# Patient Record
Sex: Female | Born: 1985 | Race: Black or African American | Hispanic: No | Smoking: Never smoker
Health system: Southern US, Community
[De-identification: ages and names within clinical notes are randomized; demographics above are authoritative.]

## PROBLEM LIST (undated history)

## (undated) ENCOUNTER — Inpatient Hospital Stay (HOSPITAL_COMMUNITY): Payer: Self-pay

## (undated) DIAGNOSIS — Z789 Other specified health status: Secondary | ICD-10-CM

## (undated) DIAGNOSIS — Z9889 Other specified postprocedural states: Secondary | ICD-10-CM

## (undated) DIAGNOSIS — R112 Nausea with vomiting, unspecified: Secondary | ICD-10-CM

## (undated) DIAGNOSIS — R011 Cardiac murmur, unspecified: Secondary | ICD-10-CM

## (undated) DIAGNOSIS — R2243 Localized swelling, mass and lump, lower limb, bilateral: Secondary | ICD-10-CM

## (undated) HISTORY — PX: BREAST SURGERY: SHX581

## (undated) HISTORY — PX: INCISIONAL BREAST BIOPSY: SHX1812

## (undated) HISTORY — DX: Cardiac murmur, unspecified: R01.1

---

## 2009-06-29 ENCOUNTER — Emergency Department (HOSPITAL_COMMUNITY): Admission: EM | Admit: 2009-06-29 | Discharge: 2009-06-29 | Payer: Self-pay | Admitting: Family Medicine

## 2010-04-26 LAB — POCT URINALYSIS DIP (DEVICE)
Bilirubin Urine: NEGATIVE
Glucose, UA: NEGATIVE mg/dL
Hgb urine dipstick: NEGATIVE
Specific Gravity, Urine: 1.025 (ref 1.005–1.030)
pH: 7 (ref 5.0–8.0)

## 2010-04-26 LAB — POCT PREGNANCY, URINE: Preg Test, Ur: NEGATIVE

## 2012-11-05 ENCOUNTER — Other Ambulatory Visit: Payer: Self-pay

## 2013-07-03 ENCOUNTER — Other Ambulatory Visit (INDEPENDENT_AMBULATORY_CARE_PROVIDER_SITE_OTHER): Payer: 59

## 2013-07-03 VITALS — BP 118/80 | HR 91 | Temp 99.2°F | Ht 66.0 in | Wt 202.0 lb

## 2013-07-03 DIAGNOSIS — N912 Amenorrhea, unspecified: Secondary | ICD-10-CM

## 2013-07-03 LAB — POCT URINE PREGNANCY: PREG TEST UR: POSITIVE

## 2013-07-03 NOTE — Patient Instructions (Signed)
Patient informed that her test is positive. Patient given prenatal vitamin samples. Patient scheduled NOB appointment with Dr Delsa Sale and reviewed symptoms of pregnancy problems.

## 2013-07-03 NOTE — Progress Notes (Signed)
Patient had positive home test.

## 2013-07-23 ENCOUNTER — Ambulatory Visit (HOSPITAL_COMMUNITY): Payer: 59 | Attending: Obstetrics & Gynecology

## 2013-07-30 LAB — OB RESULTS CONSOLE ABO/RH: "RH Type ": POSITIVE

## 2013-07-30 LAB — OB RESULTS CONSOLE GC/CHLAMYDIA
Chlamydia: NEGATIVE
Gonorrhea: NEGATIVE

## 2013-07-30 LAB — OB RESULTS CONSOLE ANTIBODY SCREEN: Antibody Screen: NEGATIVE

## 2013-07-30 LAB — OB RESULTS CONSOLE RPR: RPR: NONREACTIVE

## 2013-07-30 LAB — OB RESULTS CONSOLE HEPATITIS B SURFACE ANTIGEN: Hepatitis B Surface Ag: NEGATIVE

## 2013-07-30 LAB — OB RESULTS CONSOLE HIV ANTIBODY (ROUTINE TESTING): HIV: NONREACTIVE

## 2013-07-30 LAB — OB RESULTS CONSOLE RUBELLA ANTIBODY, IGM: RUBELLA: IMMUNE

## 2013-08-05 ENCOUNTER — Encounter: Payer: 59 | Admitting: Obstetrics & Gynecology

## 2013-08-16 ENCOUNTER — Encounter (HOSPITAL_COMMUNITY): Payer: Self-pay

## 2013-08-16 ENCOUNTER — Ambulatory Visit (HOSPITAL_COMMUNITY): Admission: RE | Admit: 2013-08-16 | Payer: 59 | Source: Ambulatory Visit

## 2013-08-16 ENCOUNTER — Ambulatory Visit (HOSPITAL_COMMUNITY)
Admission: RE | Admit: 2013-08-16 | Discharge: 2013-08-16 | Disposition: A | Discharge status: Home / Self Care | Payer: 59 | Source: Ambulatory Visit | Attending: Obstetrics & Gynecology | Admitting: Obstetrics & Gynecology

## 2013-08-16 ENCOUNTER — Other Ambulatory Visit (HOSPITAL_COMMUNITY): Payer: Self-pay | Admitting: Obstetrics & Gynecology

## 2013-08-16 ENCOUNTER — Encounter (HOSPITAL_COMMUNITY): Payer: 59

## 2013-08-16 DIAGNOSIS — Z3689 Encounter for other specified antenatal screening: Secondary | ICD-10-CM | POA: Insufficient documentation

## 2013-08-16 DIAGNOSIS — O289 Unspecified abnormal findings on antenatal screening of mother: Secondary | ICD-10-CM | POA: Insufficient documentation

## 2013-08-16 DIAGNOSIS — O283 Abnormal ultrasonic finding on antenatal screening of mother: Secondary | ICD-10-CM

## 2013-12-09 ENCOUNTER — Encounter (HOSPITAL_COMMUNITY): Payer: Self-pay

## 2014-01-22 LAB — OB RESULTS CONSOLE GBS: GBS: NEGATIVE

## 2014-02-03 ENCOUNTER — Encounter: Payer: Self-pay | Admitting: *Deleted

## 2014-02-04 ENCOUNTER — Encounter: Payer: Self-pay | Admitting: Obstetrics & Gynecology

## 2014-02-07 NOTE — L&D Delivery Note (Signed)
Delivery Note At 10:49 PM a viable female was delivered via Vaginal, Spontaneous Delivery (Presentation: ; Occiput Posterior).  APGAR: 8, 9; weight  .   Placenta status: Intact, Spontaneous.  Cord: 3 vessels with the following complications: None.  Cord pH: na  Anesthesia: Epidural  Episiotomy: None Lacerations: 2nd degree Suture Repair: 2.0 chromic Est. Blood Loss (mL): 300  Mom to postpartum.  Baby to Couplet care / Skin to Skin.  Liset Mcmonigle S 02/16/2014, 11:02 PM

## 2014-02-15 ENCOUNTER — Encounter (HOSPITAL_COMMUNITY): Payer: Self-pay | Admitting: *Deleted

## 2014-02-15 ENCOUNTER — Inpatient Hospital Stay (HOSPITAL_COMMUNITY)
Admission: AD | Admit: 2014-02-15 | Discharge: 2014-02-15 | Disposition: A | Discharge status: Home / Self Care | Payer: 59 | Source: Ambulatory Visit | Attending: Obstetrics and Gynecology | Admitting: Obstetrics and Gynecology

## 2014-02-15 DIAGNOSIS — O9989 Other specified diseases and conditions complicating pregnancy, childbirth and the puerperium: Secondary | ICD-10-CM | POA: Diagnosis present

## 2014-02-15 DIAGNOSIS — Z3A39 39 weeks gestation of pregnancy: Secondary | ICD-10-CM | POA: Diagnosis not present

## 2014-02-15 LAB — POCT FERN TEST: POCT Fern Test: NEGATIVE

## 2014-02-15 NOTE — Discharge Instructions (Signed)
Third Trimester of Pregnancy The third trimester is from week 29 through week 42, months 7 through 9. The third trimester is a time when the fetus is growing rapidly. At the end of the ninth month, the fetus is about 20 inches in length and weighs 6-10 pounds.  BODY CHANGES Your body goes through many changes during pregnancy. The changes vary from woman to woman.   Your weight will continue to increase. You can expect to gain 25-35 pounds (11-16 kg) by the end of the pregnancy.  You may begin to get stretch marks on your hips, abdomen, and breasts.  You may urinate more often because the fetus is moving lower into your pelvis and pressing on your bladder.  You may develop or continue to have heartburn as a result of your pregnancy.  You may develop constipation because certain hormones are causing the muscles that push waste through your intestines to slow down.  You may develop hemorrhoids or swollen, bulging veins (varicose veins).  You may have pelvic pain because of the weight gain and pregnancy hormones relaxing your joints between the bones in your pelvis. Backaches may result from overexertion of the muscles supporting your posture.  You may have changes in your hair. These can include thickening of your hair, rapid growth, and changes in texture. Some women also have hair loss during or after pregnancy, or hair that feels dry or thin. Your hair will most likely return to normal after your baby is born.  Your breasts will continue to grow and be tender. A yellow discharge may leak from your breasts called colostrum.  Your belly button may stick out.  You may feel short of breath because of your expanding uterus.  You may notice the fetus "dropping," or moving lower in your abdomen.  You may have a bloody mucus discharge. This usually occurs a few days to a week before labor begins.  Your cervix becomes thin and soft (effaced) near your due date. WHAT TO EXPECT AT YOUR PRENATAL  EXAMS  You will have prenatal exams every 2 weeks until week 36. Then, you will have weekly prenatal exams. During a routine prenatal visit:  You will be weighed to make sure you and the fetus are growing normally.  Your blood pressure is taken.  Your abdomen will be measured to track your baby's growth.  The fetal heartbeat will be listened to.  Any test results from the previous visit will be discussed.  You may have a cervical check near your due date to see if you have effaced. At around 36 weeks, your caregiver will check your cervix. At the same time, your caregiver will also perform a test on the secretions of the vaginal tissue. This test is to determine if a type of bacteria, Group B streptococcus, is present. Your caregiver will explain this further. Your caregiver may ask you:  What your birth plan is.  How you are feeling.  If you are feeling the baby move.  If you have had any abnormal symptoms, such as leaking fluid, bleeding, severe headaches, or abdominal cramping.  If you have any questions. Other tests or screenings that may be performed during your third trimester include:  Blood tests that check for low iron levels (anemia).  Fetal testing to check the health, activity level, and growth of the fetus. Testing is done if you have certain medical conditions or if there are problems during the pregnancy. FALSE LABOR You may feel small, irregular contractions that  eventually go away. These are called Braxton Hicks contractions, or false labor. Contractions may last for hours, days, or even weeks before true labor sets in. If contractions come at regular intervals, intensify, or become painful, it is best to be seen by your caregiver.  SIGNS OF LABOR   Menstrual-like cramps.  Contractions that are 5 minutes apart or less.  Contractions that start on the top of the uterus and spread down to the lower abdomen and back.  A sense of increased pelvic pressure or back  pain.  A watery or bloody mucus discharge that comes from the vagina. If you have any of these signs before the 37th week of pregnancy, call your caregiver right away. You need to go to the hospital to get checked immediately. HOME CARE INSTRUCTIONS   Avoid all smoking, herbs, alcohol, and unprescribed drugs. These chemicals affect the formation and growth of the baby.  Follow your caregiver's instructions regarding medicine use. There are medicines that are either safe or unsafe to take during pregnancy.  Exercise only as directed by your caregiver. Experiencing uterine cramps is a good sign to stop exercising.  Continue to eat regular, healthy meals.  Wear a good support bra for breast tenderness.  Do not use hot tubs, steam rooms, or saunas.  Wear your seat belt at all times when driving.  Avoid raw meat, uncooked cheese, cat litter boxes, and soil used by cats. These carry germs that can cause birth defects in the baby.  Take your prenatal vitamins.  Try taking a stool softener (if your caregiver approves) if you develop constipation. Eat more high-fiber foods, such as fresh vegetables or fruit and whole grains. Drink plenty of fluids to keep your urine clear or pale yellow.  Take warm sitz baths to soothe any pain or discomfort caused by hemorrhoids. Use hemorrhoid cream if your caregiver approves.  If you develop varicose veins, wear support hose. Elevate your feet for 15 minutes, 3-4 times a day. Limit salt in your diet.  Avoid heavy lifting, wear low heal shoes, and practice good posture.  Rest a lot with your legs elevated if you have leg cramps or low back pain.  Visit your dentist if you have not gone during your pregnancy. Use a soft toothbrush to brush your teeth and be gentle when you floss.  A sexual relationship may be continued unless your caregiver directs you otherwise.  Do not travel far distances unless it is absolutely necessary and only with the approval  of your caregiver.  Take prenatal classes to understand, practice, and ask questions about the labor and delivery.  Make a trial run to the hospital.  Pack your hospital bag.  Prepare the baby's nursery.  Continue to go to all your prenatal visits as directed by your caregiver. SEEK MEDICAL CARE IF:  You are unsure if you are in labor or if your water has broken.  You have dizziness.  You have mild pelvic cramps, pelvic pressure, or nagging pain in your abdominal area.  You have persistent nausea, vomiting, or diarrhea.  You have a bad smelling vaginal discharge.  You have pain with urination. SEEK IMMEDIATE MEDICAL CARE IF:   You have a fever.  You are leaking fluid from your vagina.  You have spotting or bleeding from your vagina.  You have severe abdominal cramping or pain.  You have rapid weight loss or gain.  You have shortness of breath with chest pain.  You notice sudden or extreme swelling  of your face, hands, ankles, feet, or legs.  You have not felt your baby move in over an hour.  You have severe headaches that do not go away with medicine.  You have vision changes. Document Released: 01/18/2001 Document Revised: 01/29/2013 Document Reviewed: 03/27/2012 [facility] Patient Information 2015 [place], [place]. This information is not intended to replace advice given to you by your health care provider. Make sure you discuss any questions you have with your health care provider. Fetal Movement Counts Patient Name: __________________________________________________ Patient Due Date: ____________________ Performing a fetal movement count is highly recommended in high-risk pregnancies, but it is good for every pregnant woman to do. Your health care provider may ask you to start counting fetal movements at 28 weeks of the pregnancy. Fetal movements often increase:  After eating a full meal.  After physical activity.  After eating or drinking something sweet or  cold.  At rest. Pay attention to when you feel the baby is most active. This will help you notice a pattern of your baby's sleep and wake cycles and what factors contribute to an increase in fetal movement. It is important to perform a fetal movement count at the same time each day when your baby is normally most active.  HOW TO COUNT FETAL MOVEMENTS  Find a quiet and comfortable area to sit or lie down on your left side. Lying on your left side provides the best blood and oxygen circulation to your baby.  Write down the day and time on a sheet of paper or in a journal.  Start counting kicks, flutters, swishes, rolls, or jabs in a 2-hour period. You should feel at least 10 movements within 2 hours.  If you do not feel 10 movements in 2 hours, wait 2-3 hours and count again. Look for a change in the pattern or not enough counts in 2 hours. SEEK MEDICAL CARE IF:  You feel less than 10 counts in 2 hours, tried twice.  There is no movement in over an hour.  The pattern is changing or taking longer each day to reach 10 counts in 2 hours.  You feel the baby is not moving as he or she usually does. Date: ____________ Movements: ____________ Start time: ____________ Elizebeth Koller time: ____________  Date: ____________ Movements: ____________ Start time: ____________ Elizebeth Koller time: ____________ Date: ____________ Movements: ____________ Start time: ____________ Elizebeth Koller time: ____________ Date: ____________ Movements: ____________ Start time: ____________ Elizebeth Koller time: ____________ Date: ____________ Movements: ____________ Start time: ____________ Elizebeth Koller time: ____________ Date: ____________ Movements: ____________ Start time: ____________ Elizebeth Koller time: ____________ Date: ____________ Movements: ____________ Start time: ____________ Elizebeth Koller time: ____________ Date: ____________ Movements: ____________ Start time: ____________ Elizebeth Koller time: ____________  Date: ____________ Movements: ____________ Start time:  ____________ Elizebeth Koller time: ____________ Date: ____________ Movements: ____________ Start time: ____________ Elizebeth Koller time: ____________ Date: ____________ Movements: ____________ Start time: ____________ Elizebeth Koller time: ____________ Date: ____________ Movements: ____________ Start time: ____________ Elizebeth Koller time: ____________ Date: ____________ Movements: ____________ Start time: ____________ Elizebeth Koller time: ____________ Date: ____________ Movements: ____________ Start time: ____________ Elizebeth Koller time: ____________ Date: ____________ Movements: ____________ Start time: ____________ Elizebeth Koller time: ____________  Date: ____________ Movements: ____________ Start time: ____________ Elizebeth Koller time: ____________ Date: ____________ Movements: ____________ Start time: ____________ Elizebeth Koller time: ____________ Date: ____________ Movements: ____________ Start time: ____________ Elizebeth Koller time: ____________ Date: ____________ Movements: ____________ Start time: ____________ Elizebeth Koller time: ____________ Date: ____________ Movements: ____________ Start time: ____________ Elizebeth Koller time: ____________ Date: ____________ Movements: ____________ Start time: ____________ Elizebeth Koller time: ____________ Date: ____________ Movements: ____________ Start time: ____________ Elizebeth Koller time:  ____________  Date: ____________ Movements: ____________ Start time: ____________ Elizebeth Koller time: ____________ Date: ____________ Movements: ____________ Start time: ____________ Elizebeth Koller time: ____________ Date: ____________ Movements: ____________ Start time: ____________ Elizebeth Koller time: ____________ Date: ____________ Movements: ____________ Start time: ____________ Elizebeth Koller time: ____________ Date: ____________ Movements: ____________ Start time: ____________ Elizebeth Koller time: ____________ Date: ____________ Movements: ____________ Start time: ____________ Elizebeth Koller time: ____________ Date: ____________ Movements: ____________ Start time: ____________ Elizebeth Koller time: ____________  Date:  ____________ Movements: ____________ Start time: ____________ Elizebeth Koller time: ____________ Date: ____________ Movements: ____________ Start time: ____________ Elizebeth Koller time: ____________ Date: ____________ Movements: ____________ Start time: ____________ Elizebeth Koller time: ____________ Date: ____________ Movements: ____________ Start time: ____________ Elizebeth Koller time: ____________ Date: ____________ Movements: ____________ Start time: ____________ Elizebeth Koller time: ____________ Date: ____________ Movements: ____________ Start time: ____________ Elizebeth Koller time: ____________ Date: ____________ Movements: ____________ Start time: ____________ Elizebeth Koller time: ____________  Date: ____________ Movements: ____________ Start time: ____________ Elizebeth Koller time: ____________ Date: ____________ Movements: ____________ Start time: ____________ Elizebeth Koller time: ____________ Date: ____________ Movements: ____________ Start time: ____________ Elizebeth Koller time: ____________ Date: ____________ Movements: ____________ Start time: ____________ Elizebeth Koller time: ____________ Date: ____________ Movements: ____________ Start time: ____________ Elizebeth Koller time: ____________ Date: ____________ Movements: ____________ Start time: ____________ Elizebeth Koller time: ____________ Date: ____________ Movements: ____________ Start time: ____________ Elizebeth Koller time: ____________  Date: ____________ Movements: ____________ Start time: ____________ Elizebeth Koller time: ____________ Date: ____________ Movements: ____________ Start time: ____________ Elizebeth Koller time: ____________ Date: ____________ Movements: ____________ Start time: ____________ Elizebeth Koller time: ____________ Date: ____________ Movements: ____________ Start time: ____________ Elizebeth Koller time: ____________ Date: ____________ Movements: ____________ Start time: ____________ Elizebeth Koller time: ____________ Date: ____________ Movements: ____________ Start time: ____________ Elizebeth Koller time: ____________ Date: ____________ Movements: ____________ Start  time: ____________ Elizebeth Koller time: ____________  Date: ____________ Movements: ____________ Start time: ____________ Elizebeth Koller time: ____________ Date: ____________ Movements: ____________ Start time: ____________ Elizebeth Koller time: ____________ Date: ____________ Movements: ____________ Start time: ____________ Elizebeth Koller time: ____________ Date: ____________ Movements: ____________ Start time: ____________ Elizebeth Koller time: ____________ Date: ____________ Movements: ____________ Start time: ____________ Elizebeth Koller time: ____________ Date: ____________ Movements: ____________ Start time: ____________ Elizebeth Koller time: ____________ Document Released: 02/23/2006 Document Revised: 06/10/2013 Document Reviewed: 11/21/2011 [employer] Patient Information 2015 [place]. This information is not intended to replace advice given to you by your health care provider. Make sure you discuss any questions you have with your health care provider.

## 2014-02-15 NOTE — MAU Note (Signed)
Pt presents to MAU with complaints of leakage of clear fluid at 730 this morning. Denies any vaginal bleeding

## 2014-02-16 ENCOUNTER — Inpatient Hospital Stay (HOSPITAL_COMMUNITY): Payer: 59 | Admitting: Anesthesiology

## 2014-02-16 ENCOUNTER — Encounter (HOSPITAL_COMMUNITY): Payer: Self-pay | Admitting: *Deleted

## 2014-02-16 ENCOUNTER — Inpatient Hospital Stay (HOSPITAL_COMMUNITY)
Admission: AD | Admit: 2014-02-16 | Discharge: 2014-02-18 | DRG: 775 | Disposition: A | Discharge status: Home / Self Care | Payer: 59 | Source: Ambulatory Visit | Attending: Obstetrics and Gynecology | Admitting: Obstetrics and Gynecology

## 2014-02-16 DIAGNOSIS — D279 Benign neoplasm of unspecified ovary: Secondary | ICD-10-CM | POA: Diagnosis present

## 2014-02-16 DIAGNOSIS — Z3A38 38 weeks gestation of pregnancy: Secondary | ICD-10-CM | POA: Diagnosis present

## 2014-02-16 DIAGNOSIS — O99214 Obesity complicating childbirth: Secondary | ICD-10-CM | POA: Diagnosis present

## 2014-02-16 DIAGNOSIS — Z6841 Body Mass Index (BMI) 40.0 and over, adult: Secondary | ICD-10-CM | POA: Diagnosis not present

## 2014-02-16 HISTORY — DX: Other specified health status: Z78.9

## 2014-02-16 LAB — CBC
HCT: 37.1 % (ref 36.0–46.0)
Hemoglobin: 12.4 g/dL (ref 12.0–15.0)
MCH: 29.5 pg (ref 26.0–34.0)
MCHC: 33.4 g/dL (ref 30.0–36.0)
MCV: 88.1 fL (ref 78.0–100.0)
Platelets: 185 10*3/uL (ref 150–400)
RBC: 4.21 MIL/uL (ref 3.87–5.11)
RDW: 13.9 % (ref 11.5–15.5)
WBC: 11.5 10*3/uL — AB (ref 4.0–10.5)

## 2014-02-16 LAB — TYPE AND SCREEN
ABO/RH(D): B POS
ANTIBODY SCREEN: NEGATIVE

## 2014-02-16 LAB — ABO/RH: ABO/RH(D): B POS

## 2014-02-16 LAB — POCT FERN TEST: POCT Fern Test: POSITIVE

## 2014-02-16 MED ORDER — OXYTOCIN 40 UNITS IN LACTATED RINGERS INFUSION - SIMPLE MED
62.5000 mL/h | INTRAVENOUS | Status: DC
Start: 2014-02-16 — End: 2014-02-17

## 2014-02-16 MED ORDER — FENTANYL 2.5 MCG/ML BUPIVACAINE 1/10 % EPIDURAL INFUSION (WH - ANES)
14.0000 mL/h | INTRAMUSCULAR | Status: DC | PRN
  Administered 2014-02-16 (×2): 14 mL/h via EPIDURAL
  Filled 2014-02-16: qty 125

## 2014-02-16 MED ORDER — FLEET ENEMA 7-19 GM/118ML RE ENEM
1.0000 | ENEMA | RECTAL | Status: DC | PRN
Start: 2014-02-16 — End: 2014-02-17

## 2014-02-16 MED ORDER — OXYCODONE-ACETAMINOPHEN 5-325 MG PO TABS: 2.0000 | ORAL_TABLET | ORAL | Status: DC | PRN

## 2014-02-16 MED ORDER — OXYCODONE-ACETAMINOPHEN 5-325 MG PO TABS: 1.0000 | ORAL_TABLET | ORAL | Status: DC | PRN

## 2014-02-16 MED ORDER — PHENYLEPHRINE 40 MCG/ML (10ML) SYRINGE FOR IV PUSH (FOR BLOOD PRESSURE SUPPORT)
PREFILLED_SYRINGE | INTRAVENOUS | Status: AC
  Filled 2014-02-16: qty 20

## 2014-02-16 MED ORDER — LACTATED RINGERS IV SOLN
INTRAVENOUS | Status: DC
  Administered 2014-02-16 (×3): via INTRAVENOUS

## 2014-02-16 MED ORDER — OXYTOCIN 40 UNITS IN LACTATED RINGERS INFUSION - SIMPLE MED
1.0000 m[IU]/min | INTRAVENOUS | Status: DC
  Administered 2014-02-16: 2 m[IU]/min via INTRAVENOUS
  Filled 2014-02-16: qty 1000

## 2014-02-16 MED ORDER — LIDOCAINE HCL (PF) 1 % IJ SOLN
30.0000 mL | INTRAMUSCULAR | Status: DC | PRN
  Administered 2014-02-16: 30 mL via SUBCUTANEOUS
  Filled 2014-02-16: qty 30

## 2014-02-16 MED ORDER — EPHEDRINE 5 MG/ML INJ
10.0000 mg | INTRAVENOUS | Status: DC | PRN
  Filled 2014-02-16: qty 2

## 2014-02-16 MED ORDER — ACETAMINOPHEN 325 MG PO TABS: 650.0000 mg | ORAL_TABLET | ORAL | Status: DC | PRN

## 2014-02-16 MED ORDER — CITRIC ACID-SODIUM CITRATE 334-500 MG/5ML PO SOLN: 30.0000 mL | ORAL | Status: DC | PRN

## 2014-02-16 MED ORDER — OXYCODONE-ACETAMINOPHEN 5-325 MG PO TABS
1.0000 | ORAL_TABLET | ORAL | Status: DC | PRN
Start: 2014-02-16 — End: 2014-02-17

## 2014-02-16 MED ORDER — FENTANYL 2.5 MCG/ML BUPIVACAINE 1/10 % EPIDURAL INFUSION (WH - ANES)
INTRAMUSCULAR | Status: DC | PRN
  Administered 2014-02-16: 14 mL/h via EPIDURAL

## 2014-02-16 MED ORDER — PHENYLEPHRINE 40 MCG/ML (10ML) SYRINGE FOR IV PUSH (FOR BLOOD PRESSURE SUPPORT)
80.0000 ug | PREFILLED_SYRINGE | INTRAVENOUS | Status: DC | PRN
  Filled 2014-02-16: qty 2

## 2014-02-16 MED ORDER — DIPHENHYDRAMINE HCL 50 MG/ML IJ SOLN: 12.5000 mg | INTRAMUSCULAR | Status: DC | PRN

## 2014-02-16 MED ORDER — FENTANYL 2.5 MCG/ML BUPIVACAINE 1/10 % EPIDURAL INFUSION (WH - ANES)
INTRAMUSCULAR | Status: AC
  Administered 2014-02-16: 14 mL/h via EPIDURAL
  Filled 2014-02-16: qty 125

## 2014-02-16 MED ORDER — ONDANSETRON HCL 4 MG/2ML IJ SOLN: 4.0000 mg | Freq: Four times a day (QID) | INTRAMUSCULAR | Status: DC | PRN

## 2014-02-16 MED ORDER — TERBUTALINE SULFATE 1 MG/ML IJ SOLN: 0.2500 mg | Freq: Once | INTRAMUSCULAR | Status: AC | PRN

## 2014-02-16 MED ORDER — LACTATED RINGERS IV SOLN
500.0000 mL | INTRAVENOUS | Status: DC | PRN
  Administered 2014-02-16: 500 mL via INTRAVENOUS

## 2014-02-16 MED ORDER — LACTATED RINGERS IV SOLN: 500.0000 mL | Freq: Once | INTRAVENOUS | Status: DC

## 2014-02-16 MED ORDER — LIDOCAINE HCL (PF) 1 % IJ SOLN
INTRAMUSCULAR | Status: DC | PRN
  Administered 2014-02-16: 5 mL
  Administered 2014-02-16: 3 mL
  Administered 2014-02-16: 5 mL

## 2014-02-16 MED ORDER — OXYTOCIN BOLUS FROM INFUSION
500.0000 mL | INTRAVENOUS | Status: DC
  Administered 2014-02-16: 500 mL via INTRAVENOUS

## 2014-02-16 NOTE — MAU Note (Signed)
Contractions since 1630 Sat. Was seen MAU sat morning and 1cm. Ctx closer since 2000. Some mucousy d/c but no bleeding

## 2014-02-16 NOTE — Progress Notes (Signed)
Report called to [place] in [place]. Strip reviewed. Pt to 163 via w/c

## 2014-02-16 NOTE — Progress Notes (Signed)
Dr Radene Knee notified of pt's admission and status. Aware of SROM at 0330 with cl fld, sve, ctx pattern. Will admit to [facility]

## 2014-02-16 NOTE — MAU Note (Signed)
Pt went to BR on arrival to Rm #4 and started leaking cl fld. Fern positive.

## 2014-02-16 NOTE — Anesthesia Preprocedure Evaluation (Signed)
Anesthesia Evaluation  Patient identified by MRN, date of birth, ID band Patient awake    Reviewed: Allergy & Precautions, H&P , NPO status , Patient's Chart, lab work & pertinent test results  History of Anesthesia Complications Negative for: history of anesthetic complications  Airway Mallampati: II  TM Distance: >3 FB Neck ROM: full    Dental no notable dental hx. (+) Teeth Intact   Pulmonary neg pulmonary ROS,  breath sounds clear to auscultation  Pulmonary exam normal       Cardiovascular negative cardio ROS  Rhythm:regular Rate:Normal     Neuro/Psych negative neurological ROS  negative psych ROS   GI/Hepatic negative GI ROS, Neg liver ROS,   Endo/Other  Morbid obesity  Renal/GU negative Renal ROS  negative genitourinary   Musculoskeletal   Abdominal Normal abdominal exam  (+)   Peds  Hematology negative hematology ROS (+)   Anesthesia Other Findings   Reproductive/Obstetrics (+) Pregnancy                             Anesthesia Physical Anesthesia Plan  ASA: II  Anesthesia Plan: Epidural   Post-op Pain Management:    Induction:   Airway Management Planned:   Additional Equipment:   Intra-op Plan:   Post-operative Plan:   Informed Consent: I have reviewed the patients History and Physical, chart, labs and discussed the procedure including the risks, benefits and alternatives for the proposed anesthesia with the patient or authorized representative who has indicated his/her understanding and acceptance.     Plan Discussed with:   Anesthesia Plan Comments:         Anesthesia Quick Evaluation

## 2014-02-16 NOTE — Anesthesia Procedure Notes (Signed)
Epidural Patient location during procedure: OB  Staffing Anesthesiologist: Montez Hageman Performed by: anesthesiologist   Preanesthetic Checklist Completed: patient identified, site marked, surgical consent, pre-op evaluation, timeout performed, IV checked, risks and benefits discussed and monitors and equipment checked  Epidural Patient position: sitting Prep: ChloraPrep Patient monitoring: heart rate, continuous pulse ox and blood pressure Approach: right paramedian Location: L4-L5 Injection technique: LOR saline  Needle:  Needle type: Tuohy  Needle gauge: 17 G Needle length: 9 cm and 9 Needle insertion depth: 6 cm Catheter type: closed end flexible Catheter size: 20 Guage Catheter at skin depth: 11 cm Test dose: negative  Assessment Events: blood not aspirated, injection not painful, no injection resistance, negative IV test and no paresthesia  Additional Notes   Patient tolerated the insertion well without complications.

## 2014-02-16 NOTE — H&P (Signed)
Erika Howe is a 29 y.o. female presenting at 93.6 with srom.  Negative GBS. Dermoid of ovary. Maternal Medical History:  Reason for admission: Rupture of membranes.   Contractions: Onset was less than 1 hour ago.   Frequency: regular.   Perceived severity is moderate.    Fetal activity: Perceived fetal activity is normal.    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    OB History    Gravida Para Term Preterm AB TAB SAB Ectopic Multiple Living   5    4 4     0     Past Medical History  Diagnosis Date  . Medical history non-contributory    Past Surgical History  Procedure Laterality Date  . Incisional breast biopsy Right    Family History: family history includes Mental illness in her mother. Social History:  reports that she has never smoked. She does not have any smokeless tobacco history on file. She reports that she drinks alcohol. She reports that she does not use illicit drugs.   Prenatal Transfer Tool  Maternal Diabetes: No Genetic Screening: Normal Maternal Ultrasounds/Referrals: Normal Fetal Ultrasounds or other Referrals:  None Maternal Substance Abuse:  No Significant Maternal Medications:  None Significant Maternal Lab Results:  None Other Comments:  None  ROS  Dilation: 1.5 Effacement (%): 80 Station: -2 Exam by:: Erika Howe rNC Blood pressure 122/70, pulse 97, temperature 98.2 F (36.8 C), temperature source Oral, resp. rate 18, height 5\' 6"  (1.676 m), weight 247 lb (112.038 kg), last menstrual period 05/24/2013. Maternal Exam:  Uterine Assessment: Contraction strength is moderate.  Contraction frequency is regular.   Abdomen: Patient reports no abdominal tenderness. Estimated fetal weight is 8.   Fetal presentation: vertex  Introitus: Amniotic fluid character: clear.  Cervix: 2-3  Cm 100 % eff vtx at -1  Fetal Exam Fetal State Assessment: Category I - tracings are  normal.     Physical Exam  Prenatal labs: ABO, Rh: --/--/B POS (01/10 0425) Antibody: NEG (01/10 0425) Rubella: Immune (06/23 0000) RPR: Nonreactive (06/23 0000)  HBsAg: Negative (06/23 0000)  HIV: Non-reactive (06/23 0000)  GBS: Negative (12/16 0000)   Assessment/Plan: IUP at 38.6 with SROM Dermoid of ovary Routine labor and delivery   Erika Howe 02/16/2014, 7:19 AM

## 2014-02-16 NOTE — Progress Notes (Signed)
Patient ID: Erika Howe, female   DOB: 1985-08-12, 29 y.o.   MRN: [id number] Cervix now 5 cm and 100%   vtx -1 fhr cat one IUPC placed

## 2014-02-17 ENCOUNTER — Encounter (HOSPITAL_COMMUNITY): Payer: Self-pay | Admitting: General Practice

## 2014-02-17 LAB — CBC
HCT: 32 % — ABNORMAL LOW (ref 36.0–46.0)
Hemoglobin: 10.9 g/dL — ABNORMAL LOW (ref 12.0–15.0)
MCH: 30.1 pg (ref 26.0–34.0)
MCHC: 34.1 g/dL (ref 30.0–36.0)
MCV: 88.4 fL (ref 78.0–100.0)
PLATELETS: 183 10*3/uL (ref 150–400)
RBC: 3.62 MIL/uL — ABNORMAL LOW (ref 3.87–5.11)
RDW: 13.9 % (ref 11.5–15.5)
WBC: 14.8 10*3/uL — AB (ref 4.0–10.5)

## 2014-02-17 LAB — RPR: RPR: NONREACTIVE

## 2014-02-17 MED ORDER — BISACODYL 10 MG RE SUPP: 10.0000 mg | Freq: Every day | RECTAL | Status: DC | PRN

## 2014-02-17 MED ORDER — DIPHENHYDRAMINE HCL 25 MG PO CAPS: 25.0000 mg | ORAL_CAPSULE | Freq: Four times a day (QID) | ORAL | Status: DC | PRN

## 2014-02-17 MED ORDER — WITCH HAZEL-GLYCERIN EX PADS: 1.0000 "application " | MEDICATED_PAD | CUTANEOUS | Status: DC | PRN

## 2014-02-17 MED ORDER — OXYCODONE-ACETAMINOPHEN 5-325 MG PO TABS: 2.0000 | ORAL_TABLET | ORAL | Status: DC | PRN

## 2014-02-17 MED ORDER — SENNOSIDES-DOCUSATE SODIUM 8.6-50 MG PO TABS
2.0000 | ORAL_TABLET | ORAL | Status: DC
  Filled 2014-02-17: qty 2

## 2014-02-17 MED ORDER — TETANUS-DIPHTH-ACELL PERTUSSIS 5-2.5-18.5 LF-MCG/0.5 IM SUSP: 0.5000 mL | Freq: Once | INTRAMUSCULAR | Status: DC

## 2014-02-17 MED ORDER — IBUPROFEN 600 MG PO TABS
600.0000 mg | ORAL_TABLET | Freq: Four times a day (QID) | ORAL | Status: DC
  Administered 2014-02-17 – 2014-02-18 (×4): 600 mg via ORAL
  Filled 2014-02-17 (×6): qty 1

## 2014-02-17 MED ORDER — ZOLPIDEM TARTRATE 5 MG PO TABS: 5.0000 mg | ORAL_TABLET | Freq: Every evening | ORAL | Status: DC | PRN

## 2014-02-17 MED ORDER — ONDANSETRON HCL 4 MG PO TABS: 4.0000 mg | ORAL_TABLET | ORAL | Status: DC | PRN

## 2014-02-17 MED ORDER — FLEET ENEMA 7-19 GM/118ML RE ENEM: 1.0000 | ENEMA | Freq: Every day | RECTAL | Status: DC | PRN

## 2014-02-17 MED ORDER — ONDANSETRON HCL 4 MG/2ML IJ SOLN: 4.0000 mg | INTRAMUSCULAR | Status: DC | PRN

## 2014-02-17 MED ORDER — PRENATAL MULTIVITAMIN CH
1.0000 | ORAL_TABLET | Freq: Every day | ORAL | Status: DC
  Administered 2014-02-17 – 2014-02-18 (×2): 1 via ORAL
  Filled 2014-02-17: qty 1

## 2014-02-17 MED ORDER — DIBUCAINE 1 % RE OINT: 1.0000 "application " | TOPICAL_OINTMENT | RECTAL | Status: DC | PRN

## 2014-02-17 MED ORDER — SIMETHICONE 80 MG PO CHEW: 80.0000 mg | CHEWABLE_TABLET | ORAL | Status: DC | PRN

## 2014-02-17 MED ORDER — LANOLIN HYDROUS EX OINT: TOPICAL_OINTMENT | CUTANEOUS | Status: DC | PRN

## 2014-02-17 MED ORDER — BENZOCAINE-MENTHOL 20-0.5 % EX AERO
1.0000 "application " | INHALATION_SPRAY | CUTANEOUS | Status: DC | PRN
  Administered 2014-02-18: 1 via TOPICAL
  Filled 2014-02-17 (×2): qty 56

## 2014-02-17 NOTE — Progress Notes (Signed)
Post Partum Day 1 Subjective: no complaints, up ad lib, voiding and tolerating PO  Objective: Blood pressure 116/66, pulse 96, temperature 98.1 F (36.7 C), temperature source Oral, resp. rate 16, height 5\' 6"  (1.676 m), weight 247 lb (112.038 kg), last menstrual period 05/24/2013, SpO2 99 %, unknown if currently breastfeeding.  Physical Exam:  General: alert and cooperative Lochia: appropriate Uterine Fundus: firm Incision: healing well DVT Evaluation: No evidence of DVT seen on physical exam. Negative Homan's sign. No cords or calf tenderness. Calf/Ankle edema is present.   Recent Labs  02/16/14 0425 02/17/14 0553  HGB 12.4 10.9*  HCT 37.1 32.0*    Assessment/Plan: Plan for discharge tomorrow   LOS: 1 day   Ghada Abbett G 02/17/2014, 7:55 AM

## 2014-02-17 NOTE — Anesthesia Postprocedure Evaluation (Signed)
Anesthesia Post Note  Patient: Erika Howe  Procedure(s) Performed: * No procedures listed *  Anesthesia type: Epidural  Patient location: Mother/Baby  Post pain: Pain level controlled  Post assessment: Post-op Vital signs reviewed  Last Vitals:  Filed Vitals:   02/17/14 0600  BP: 116/66  Pulse: 96  Temp: 36.7 C  Resp: 16    Post vital signs: Reviewed  Level of consciousness:alert  Complications: No apparent anesthesia complications

## 2014-02-17 NOTE — Lactation Note (Signed)
This note was copied from the chart of Girl Pammie Chirino. Lactation Consultation Note New mom stating BF going well. Denies painful latching. Stated RN showed her how to latch and express colostrum. Baby sleeping soundly now. Mom encouraged to feed baby 8-12 times/24 hours and with feeding cues. Mom encouraged to do skin-to-skin. Mom encouraged to waken baby for feeds. Educated about newborn behavior. Referred to Baby and Me Book in Breastfeeding section Pg. 22-23 for position options and Proper latch demonstration.[place] brochure given w/resources, support groups and [place] services.Encouraged to call for assistance if needed and to verify proper latch. Patient Name: Girl Diva Lemberger [id number] Date: 02/17/2014 Reason for consult: Initial assessment   Maternal Data Has patient been taught Hand Expression?: Yes Does the patient have breastfeeding experience prior to this delivery?: No  Feeding Feeding Type: Breast Fed Length of feed: 35 min  LATCH Score/Interventions Latch: Grasps breast easily, tongue down, lips flanged, rhythmical sucking. Intervention(s): Skin to skin;Teach feeding cues;Waking techniques  Audible Swallowing: A few with stimulation Intervention(s): Skin to skin;Hand expression Intervention(s): Skin to skin;Hand expression  Type of Nipple: Everted at rest and after stimulation (large)  Comfort (Breast/Nipple): Soft / non-tender     Hold (Positioning): Assistance needed to correctly position infant at breast and maintain latch. Intervention(s): Breastfeeding basics reviewed;Support Pillows;Position options;Skin to skin  LATCH Score: 8  Lactation Tools Discussed/Used     Consult Status Consult Status: Follow-up Date: 02/18/13 Follow-up type: In-patient    Theodoro Kalata 02/17/2014, 5:52 AM

## 2014-02-18 MED ORDER — IBUPROFEN 600 MG PO TABS: 600.0000 mg | ORAL_TABLET | Freq: Four times a day (QID) | ORAL | Status: DC

## 2014-02-18 MED ORDER — OXYCODONE-ACETAMINOPHEN 5-325 MG PO TABS: 1.0000 | ORAL_TABLET | ORAL | Status: DC | PRN

## 2014-02-18 NOTE — Discharge Summary (Signed)
Obstetric Discharge Summary Reason for Admission: rupture of membranes Prenatal Procedures: ultrasound Intrapartum Procedures: spontaneous vaginal delivery Postpartum Procedures: none Complications-Operative and Postpartum: 2 degree perineal laceration HEMOGLOBIN  Date Value Ref Range Status  02/17/2014 10.9* 12.0 - 15.0 g/dL Final   HCT  Date Value Ref Range Status  02/17/2014 32.0* 36.0 - 46.0 % Final    Physical Exam:  General: alert and cooperative Lochia: appropriate Uterine Fundus: firm Incision: healing well DVT Evaluation: No evidence of DVT seen on physical exam. Negative Homan's sign. No cords or calf tenderness. Calf/Ankle edema is present.  Discharge Diagnoses: Term Pregnancy-delivered  Discharge Information: Date: 02/18/2014 Activity: pelvic rest Diet: routine Medications: PNV, Ibuprofen and Percocet Condition: stable Instructions: refer to practice specific booklet Discharge to: home   Newborn Data: Live born female  Birth Weight: 7 lb 13 oz (3544 g) APGAR: 8, 9  Home with mother.  Erika Howe G 02/18/2014, 8:06 AM

## 2014-02-18 NOTE — Lactation Note (Signed)
This note was copied from the chart of Erika Quiara Killian. Lactation Consultation Note: Mom reports that baby has nursed a lot through the night. Sleepy at present. Attempted to latch- baby latched well. Mom reports no pain. No stool since birth. Mom easily able to hand express Colostrum. No questions at present. To call prn  Patient Name: Erika Howe [id number] Date: 02/18/2014 Reason for consult: Follow-up assessment   Maternal Data Formula Feeding for Exclusion: No Has patient been taught Hand Expression?: Yes Does the patient have breastfeeding experience prior to this delivery?: No  Feeding Feeding Type: Breast Fed  LATCH Score/Interventions Latch: Grasps breast easily, tongue down, lips flanged, rhythmical sucking.  Audible Swallowing: A few with stimulation Intervention(s): Hand expression;Skin to skin  Type of Nipple: Everted at rest and after stimulation  Comfort (Breast/Nipple): Soft / non-tender     Hold (Positioning): Assistance needed to correctly position infant at breast and maintain latch. Intervention(s): Breastfeeding basics reviewed  LATCH Score: 8  Lactation Tools Discussed/Used     Consult Status Consult Status: Complete    Truddie Crumble 02/18/2014, 8:14 AM

## 2014-02-19 ENCOUNTER — Ambulatory Visit: Payer: Self-pay

## 2014-02-19 NOTE — Lactation Note (Signed)
This note was copied from the chart of Erika Katrese Shell. Lactation Consultation Note  Patient Name: Erika Howe Date: 02/19/2014   Visited with Mom on day of discharge, baby 24 hrs old.  Mom states breast feeding is going well.  No questions at present.  Encouraged skin to skin, and cue based feedings.  Engorgement prevention and treatment discussed.  Reminded Mom of OP lactation services available.  To call prn.      Broadus John 02/19/2014, 11:29 AM

## 2014-02-20 ENCOUNTER — Ambulatory Visit: Payer: Self-pay

## 2014-02-20 NOTE — Lactation Note (Addendum)
This note was copied from the chart of Erika Howe. Lactation Consultation Note  Patient Name: Erika Markella Dao [id number] Date: 02/20/2014 Reason for consult: Follow-up assessment;Infant weight loss Baby awake at this visit. Mom was able to obtain more depth with the latch. Baby demonstrated a nutritive suckling pattern with noted swallows on the left breast. On the right breast baby was sleepy, nursed for 10 minutes with stimulation, some swallows noted. When baby came off the breast, breast milk visible on baby's lips. On both breasts slight compression line with nursing but Mom denies any discomfort. Mom reports baby more awake at night and more interested in the breast in the evening and at night. Due to weight loss, advised Mom to pre/pump or hand express to get milk flow going, keep baby actively nursing for 15-20 minutes, both breasts if possible. Post pump during the day when baby sleepy at the breast and give back any amount of EBM Mom receives. Demonstrated how to use foley cup for supplementing, Mom may use Dr. Owens Shark bottle with preemie nipple. When her milk is fully in, Mom could prepump to remove lower fat milk if weight loss still not on track, breastfeed both breasts then post pump as needed for comfort and to empty breasts to protect milk supply. Give baby back EBM. Engorgement care reviewed if needed. Mom has OP f/u with Lactation for Tuesday, 02/25/14 at 2:30. Peds visit tomorrow. Call for questions/concerns. Mom has DEBP for home use. Hand pump given here, flange changed to size 27.   Maternal Data    Feeding Feeding Type: Breast Fed Length of feed: 10 min  LATCH Score/Interventions Latch: Grasps breast easily, tongue down, lips flanged, rhythmical sucking. Intervention(s): Adjust position;Assist with latch;Breast massage;Breast compression  Audible Swallowing: A few with stimulation  Type of Nipple: Everted at rest and after stimulation  Comfort (Breast/Nipple):  Filling, red/small blisters or bruises, mild/mod discomfort  Problem noted: Filling  Hold (Positioning): Assistance needed to correctly position infant at breast and maintain latch. Intervention(s): Breastfeeding basics reviewed;Support Pillows;Position options;Skin to skin  LATCH Score: 7  Lactation Tools Discussed/Used Tools: Pump Breast pump type: Manual   Consult Status Consult Status: Complete Date: 02/20/14 Follow-up type: In-patient    Katrine Coho 02/20/2014, 1:37 PM

## 2014-02-20 NOTE — Lactation Note (Signed)
This note was copied from the chart of Erika Howe. Lactation Consultation Note  Patient Name: Erika Howe Date: 02/20/2014 Reason for consult: Follow-up assessment;Infant weight loss Baby at the breast when [facility] arrived, becoming sleepy. Baby more awake after Peds exam. Mom re-latching baby. LC notes baby not obtaining good depth with latch, small mouth when latched and not sustaining a nutritive suckling pattern. Mom's breasts are filling but not engorged. Colostrum present with hand expression. [place] assisted Mom with positioning to obtain more depth. Demonstrated how to bring bottom lip down. Baby demonstrating nutritive and non-nutritive suckling pattern, some chewing noted with slight dimpling, baby does not keep lower lip well flanged.  Baby at the breast for 15 minutes with 1-2 swallows noted. With suck exam, baby has chewing motions, tongue will extend to gum line but not past lower lip. Short frenulum noted. Since baby sleepy at this visit and this LC's 1st time observing a feeding, left phone number for Mom to call with next feeding for LC to observe for more nutritive suckling pattern due to 9% weight loss. Baby has been to the breast numerous times, adequate output since KUB. Engorgement care reviewed if needed. OP f/u scheduled with LC for Tuesday, 02/25/14 at 2:30. Mom to call. Peds advised.  Maternal Data    Feeding Feeding Type: Breast Fed Length of feed: 15 min  LATCH Score/Interventions Latch: Repeated attempts needed to sustain latch, nipple held in mouth throughout feeding, stimulation needed to elicit sucking reflex. Intervention(s): Adjust position;Assist with latch;Breast massage;Breast compression  Audible Swallowing: A few with stimulation  Type of Nipple: Everted at rest and after stimulation  Comfort (Breast/Nipple): Filling, red/small blisters or bruises, mild/mod discomfort  Problem noted: Filling  Hold (Positioning): Assistance needed to  correctly position infant at breast and maintain latch. Intervention(s): Breastfeeding basics reviewed;Support Pillows;Position options;Skin to skin  LATCH Score: 6  Lactation Tools Discussed/Used     Consult Status Consult Status: Follow-up Date: 02/20/14 Follow-up type: In-patient    Katrine Coho 02/20/2014, 10:15 AM

## 2014-02-25 ENCOUNTER — Ambulatory Visit (HOSPITAL_COMMUNITY)
Admission: RE | Admit: 2014-02-25 | Discharge: 2014-02-25 | Disposition: A | Discharge status: Home / Self Care | Payer: 59 | Source: Ambulatory Visit | Attending: Obstetrics & Gynecology | Admitting: Obstetrics & Gynecology

## 2014-12-05 ENCOUNTER — Other Ambulatory Visit: Payer: Self-pay | Admitting: Obstetrics & Gynecology

## 2014-12-05 DIAGNOSIS — N6310 Unspecified lump in the right breast, unspecified quadrant: Secondary | ICD-10-CM

## 2014-12-09 ENCOUNTER — Other Ambulatory Visit: Payer: 59

## 2014-12-09 ENCOUNTER — Ambulatory Visit
Admission: RE | Admit: 2014-12-09 | Discharge: 2014-12-09 | Disposition: A | Discharge status: Home / Self Care | Payer: 59 | Source: Ambulatory Visit | Attending: Obstetrics & Gynecology | Admitting: Obstetrics & Gynecology

## 2014-12-09 DIAGNOSIS — N6310 Unspecified lump in the right breast, unspecified quadrant: Secondary | ICD-10-CM

## 2015-04-02 ENCOUNTER — Inpatient Hospital Stay (HOSPITAL_COMMUNITY): Admission: RE | Admit: 2015-04-02 | Payer: 59 | Source: Ambulatory Visit

## 2015-04-29 NOTE — Patient Instructions (Signed)
Your procedure is scheduled on:  Thursday, May 07, 2015  Enter through the Main Entrance of [facility] at:  6:00 AM  Pick up the phone at the desk and dial [phone].  Call this number if you have problems the morning of surgery: [phone].  Remember: Do NOT eat food or drink after:  Midnight Wednesday  Take these medicines the morning of surgery with a SIP OF WATER:  None  Do NOT wear jewelry (body piercing), metal hair clips/bobby pins, make-up, or nail polish. Do NOT wear lotions, powders, or perfumes.  You may wear deodorant. Do NOT shave for 48 hours prior to surgery. Do NOT bring valuables to the hospital. Contacts, dentures, or bridgework may not be worn into surgery.  Have a responsible adult drive you home and stay with you for 24 hours after your procedure

## 2015-04-30 ENCOUNTER — Encounter (HOSPITAL_COMMUNITY): Payer: Self-pay

## 2015-04-30 ENCOUNTER — Encounter (HOSPITAL_COMMUNITY)
Admission: RE | Admit: 2015-04-30 | Discharge: 2015-04-30 | Disposition: A | Discharge status: Home / Self Care | Payer: 59 | Source: Ambulatory Visit | Attending: Obstetrics & Gynecology | Admitting: Obstetrics & Gynecology

## 2015-04-30 DIAGNOSIS — Z01812 Encounter for preprocedural laboratory examination: Secondary | ICD-10-CM | POA: Diagnosis not present

## 2015-04-30 LAB — CBC
HEMATOCRIT: 40.2 % (ref 36.0–46.0)
HEMOGLOBIN: 13.1 g/dL (ref 12.0–15.0)
MCH: 28.2 pg (ref 26.0–34.0)
MCHC: 32.6 g/dL (ref 30.0–36.0)
MCV: 86.6 fL (ref 78.0–100.0)
Platelets: 251 10*3/uL (ref 150–400)
RBC: 4.64 MIL/uL (ref 3.87–5.11)
RDW: 13.8 % (ref 11.5–15.5)
WBC: 9.5 10*3/uL (ref 4.0–10.5)

## 2015-05-03 NOTE — H&P (Signed)
Erika Howe is an 30 y.o. female with right ovarian mass; complex and suspicious for dermoid, measuring 6.4 x 4.7 cm.  The patient has had no symptoms related to the cyst and it has increased in size minimally from initial diagnosis in 2015 on confirmation u/s.    Pertinent Gynecological History: Menses: flow is moderate Bleeding: regular Contraception: condoms DES exposure: unknown Blood transfusions: none Sexually transmitted diseases: no past history Previous GYN Procedures: none  Last mammogram: n/a Date: n/a Last pap: normal Date: 03/2014 OB History: G4, P1   Menstrual History: Menarche age: n/a No LMP recorded.    Past Medical History  Diagnosis Date  . Medical history non-contributory     Past Surgical History  Procedure Laterality Date  . Incisional breast biopsy Right     Family History  Problem Relation Age of Onset  . Mental illness Mother     Social History:  reports that she has never smoked. She has never used smokeless tobacco. She reports that she drinks alcohol. She reports that she does not use illicit drugs.  Allergies:  Allergies  Allergen Reactions  . Doxycycline Nausea And Vomiting    No prescriptions prior to admission    ROS  currently breastfeeding. Physical Exam  Constitutional: She is oriented to person, place, and time. She appears well-developed and well-nourished.  Cardiovascular: Normal rate and regular rhythm.   Respiratory: Effort normal and breath sounds normal.  GI: Soft. There is no rebound and no guarding.  Neurological: She is alert and oriented to person, place, and time.  Skin: Skin is warm and dry.  Psychiatric: She has a normal mood and affect. Her behavior is normal.    No results found for this or any previous visit (from the past 24 hour(s)).  No results found.  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  She understands the risk of bleeding, infection, scarring and damage to surrounding structures.  She also understands the risk of losing the right ovary if unable to remove the cyst alone.  All questions were answered and the patient wishes to proceed.  Kerry Chisolm, [place] 05/03/2015, 8:11 PM

## 2015-05-21 ENCOUNTER — Ambulatory Visit: Payer: Self-pay | Admitting: Gynecology

## 2015-05-25 ENCOUNTER — Ambulatory Visit: Payer: Self-pay | Admitting: Gynecology

## 2015-05-25 DIAGNOSIS — Z0289 Encounter for other administrative examinations: Secondary | ICD-10-CM

## 2015-06-12 ENCOUNTER — Other Ambulatory Visit: Payer: Self-pay | Admitting: Obstetrics & Gynecology

## 2015-06-12 DIAGNOSIS — N63 Unspecified lump in unspecified breast: Secondary | ICD-10-CM

## 2015-06-25 ENCOUNTER — Other Ambulatory Visit: Payer: 59

## 2015-07-01 NOTE — Patient Instructions (Signed)
Your procedure is scheduled on:  Thursday, July 09, 2015  Enter through the [employer] of [facility] at: 6:00 AM  Pick up the phone at the desk and dial [phone].  Call this number if you have problems the morning of surgery: [phone].  Remember: Do NOT eat food or drink after:  Midnight Wednesday, Jul 08, 2015  Take these medicines the morning of surgery with a SIP OF WATER:  None  Do NOT wear jewelry (body piercing), metal hair clips/bobby pins, make-up, or nail polish. Do NOT wear lotions, powders, or perfumes.  You may wear deodorant. Do NOT shave for 48 hours prior to surgery. Do NOT bring valuables to the hospital. Contacts, dentures, or bridgework may not be worn into surgery.  Leave suitcase in car.  After surgery it may be brought to your room.  For patients admitted to the hospital, checkout time is 11:00 AM the day of discharge.

## 2015-07-02 ENCOUNTER — Encounter (HOSPITAL_COMMUNITY): Payer: Self-pay

## 2015-07-02 ENCOUNTER — Encounter (HOSPITAL_COMMUNITY)
Admission: RE | Admit: 2015-07-02 | Discharge: 2015-07-02 | Disposition: A | Discharge status: Home / Self Care | Payer: 59 | Source: Ambulatory Visit | Attending: Obstetrics & Gynecology | Admitting: Obstetrics & Gynecology

## 2015-07-02 DIAGNOSIS — R1909 Other intra-abdominal and pelvic swelling, mass and lump: Secondary | ICD-10-CM | POA: Insufficient documentation

## 2015-07-02 DIAGNOSIS — Z01812 Encounter for preprocedural laboratory examination: Secondary | ICD-10-CM | POA: Insufficient documentation

## 2015-07-02 HISTORY — DX: Localized swelling, mass and lump, lower limb, bilateral: R22.43

## 2015-07-02 LAB — CBC
HCT: 39.6 % (ref 36.0–46.0)
Hemoglobin: 12.8 g/dL (ref 12.0–15.0)
MCH: 27.5 pg (ref 26.0–34.0)
MCHC: 32.3 g/dL (ref 30.0–36.0)
MCV: 85 fL (ref 78.0–100.0)
PLATELETS: 245 10*3/uL (ref 150–400)
RBC: 4.66 MIL/uL (ref 3.87–5.11)
RDW: 14 % (ref 11.5–15.5)
WBC: 9.1 10*3/uL (ref 4.0–10.5)

## 2015-07-03 ENCOUNTER — Other Ambulatory Visit: Payer: 59

## 2015-07-08 NOTE — Anesthesia Preprocedure Evaluation (Signed)

## 2015-07-09 ENCOUNTER — Ambulatory Visit (HOSPITAL_COMMUNITY): Payer: 59 | Admitting: Anesthesiology

## 2015-07-09 ENCOUNTER — Encounter (HOSPITAL_COMMUNITY): Payer: Self-pay | Admitting: Emergency Medicine

## 2015-07-09 ENCOUNTER — Encounter (HOSPITAL_COMMUNITY)
Admission: RE | Disposition: A | Discharge status: Home / Self Care | Payer: Self-pay | Source: Ambulatory Visit | Attending: Obstetrics & Gynecology

## 2015-07-09 ENCOUNTER — Observation Stay (HOSPITAL_COMMUNITY)
Admission: RE | Admit: 2015-07-09 | Discharge: 2015-07-10 | Disposition: A | Discharge status: Home / Self Care | Payer: 59 | Source: Ambulatory Visit | Attending: Obstetrics & Gynecology | Admitting: Obstetrics & Gynecology

## 2015-07-09 DIAGNOSIS — D270 Benign neoplasm of right ovary: Secondary | ICD-10-CM | POA: Diagnosis not present

## 2015-07-09 DIAGNOSIS — R222 Localized swelling, mass and lump, trunk: Secondary | ICD-10-CM | POA: Diagnosis present

## 2015-07-09 DIAGNOSIS — Z881 Allergy status to other antibiotic agents status: Secondary | ICD-10-CM | POA: Diagnosis not present

## 2015-07-09 DIAGNOSIS — Z90721 Acquired absence of ovaries, unilateral: Secondary | ICD-10-CM

## 2015-07-09 HISTORY — DX: Nausea with vomiting, unspecified: R11.2

## 2015-07-09 HISTORY — DX: Other specified postprocedural states: Z98.890

## 2015-07-09 HISTORY — PX: OOPHORECTOMY: SHX6387

## 2015-07-09 HISTORY — PX: LAPAROTOMY: SHX154

## 2015-07-09 LAB — PREGNANCY, URINE: PREG TEST UR: NEGATIVE

## 2015-07-09 SURGERY — LAPAROTOMY
Anesthesia: General | Laterality: Right

## 2015-07-09 MED ORDER — ALUM & MAG HYDROXIDE-SIMETH 200-200-20 MG/5ML PO SUSP: 30.0000 mL | ORAL | Status: DC | PRN

## 2015-07-09 MED ORDER — KETOROLAC TROMETHAMINE 30 MG/ML IJ SOLN
INTRAMUSCULAR | Status: DC | PRN
  Administered 2015-07-09: 30 mg via INTRAVENOUS

## 2015-07-09 MED ORDER — LACTATED RINGERS IV SOLN
INTRAVENOUS | Status: DC
  Administered 2015-07-09: 07:00:00 via INTRAVENOUS

## 2015-07-09 MED ORDER — HYDROMORPHONE HCL 1 MG/ML IJ SOLN
INTRAMUSCULAR | Status: AC
  Filled 2015-07-09: qty 1

## 2015-07-09 MED ORDER — LIDOCAINE HCL (CARDIAC) 20 MG/ML IV SOLN
INTRAVENOUS | Status: DC | PRN
  Administered 2015-07-09: 30 mg via INTRAVENOUS
  Administered 2015-07-09: 70 mg via INTRAVENOUS

## 2015-07-09 MED ORDER — ROCURONIUM BROMIDE 100 MG/10ML IV SOLN
INTRAVENOUS | Status: DC | PRN
  Administered 2015-07-09: 40 mg via INTRAVENOUS

## 2015-07-09 MED ORDER — SCOPOLAMINE 1 MG/3DAYS TD PT72
MEDICATED_PATCH | TRANSDERMAL | Status: AC
  Filled 2015-07-09: qty 1

## 2015-07-09 MED ORDER — LIDOCAINE HCL (CARDIAC) 20 MG/ML IV SOLN
INTRAVENOUS | Status: AC
  Filled 2015-07-09: qty 5

## 2015-07-09 MED ORDER — KETOROLAC TROMETHAMINE 30 MG/ML IJ SOLN: 30.0000 mg | Freq: Once | INTRAMUSCULAR | Status: DC | PRN

## 2015-07-09 MED ORDER — FENTANYL CITRATE (PF) 250 MCG/5ML IJ SOLN
INTRAMUSCULAR | Status: AC
  Filled 2015-07-09: qty 5

## 2015-07-09 MED ORDER — FENTANYL CITRATE (PF) 100 MCG/2ML IJ SOLN
INTRAMUSCULAR | Status: DC | PRN
  Administered 2015-07-09 (×4): 50 ug via INTRAVENOUS

## 2015-07-09 MED ORDER — MIDAZOLAM HCL 2 MG/2ML IJ SOLN
INTRAMUSCULAR | Status: AC
  Filled 2015-07-09: qty 2

## 2015-07-09 MED ORDER — DEXAMETHASONE SODIUM PHOSPHATE 10 MG/ML IJ SOLN
INTRAMUSCULAR | Status: DC | PRN
  Administered 2015-07-09: 4 mg via INTRAVENOUS

## 2015-07-09 MED ORDER — PROPOFOL 10 MG/ML IV BOLUS
INTRAVENOUS | Status: AC
  Filled 2015-07-09: qty 20

## 2015-07-09 MED ORDER — ONDANSETRON HCL 4 MG/2ML IJ SOLN: 4.0000 mg | Freq: Four times a day (QID) | INTRAMUSCULAR | Status: DC | PRN

## 2015-07-09 MED ORDER — SCOPOLAMINE 1 MG/3DAYS TD PT72
MEDICATED_PATCH | TRANSDERMAL | Status: AC
  Administered 2015-07-09: 1.5 mg via TRANSDERMAL
  Filled 2015-07-09: qty 1

## 2015-07-09 MED ORDER — ONDANSETRON HCL 4 MG PO TABS: 4.0000 mg | ORAL_TABLET | Freq: Four times a day (QID) | ORAL | Status: DC | PRN

## 2015-07-09 MED ORDER — KETOROLAC TROMETHAMINE 30 MG/ML IJ SOLN
INTRAMUSCULAR | Status: AC
  Filled 2015-07-09: qty 1

## 2015-07-09 MED ORDER — KETOROLAC TROMETHAMINE 30 MG/ML IJ SOLN
30.0000 mg | Freq: Four times a day (QID) | INTRAMUSCULAR | Status: DC
  Administered 2015-07-09 – 2015-07-10 (×5): 30 mg via INTRAVENOUS
  Filled 2015-07-09 (×5): qty 1

## 2015-07-09 MED ORDER — ONDANSETRON HCL 4 MG/2ML IJ SOLN
INTRAMUSCULAR | Status: AC
  Filled 2015-07-09: qty 2

## 2015-07-09 MED ORDER — SUGAMMADEX SODIUM 200 MG/2ML IV SOLN
INTRAVENOUS | Status: DC | PRN
  Administered 2015-07-09: 154.6 mg via INTRAVENOUS

## 2015-07-09 MED ORDER — DEXAMETHASONE SODIUM PHOSPHATE 4 MG/ML IJ SOLN
INTRAMUSCULAR | Status: AC
  Filled 2015-07-09: qty 1

## 2015-07-09 MED ORDER — PROPOFOL 10 MG/ML IV BOLUS
INTRAVENOUS | Status: DC | PRN
  Administered 2015-07-09: 200 mg via INTRAVENOUS

## 2015-07-09 MED ORDER — ONDANSETRON HCL 4 MG/2ML IJ SOLN
INTRAMUSCULAR | Status: DC | PRN
Start: 2015-07-09 — End: 2015-07-09
  Administered 2015-07-09: 4 mg via INTRAVENOUS

## 2015-07-09 MED ORDER — CEFAZOLIN SODIUM-DEXTROSE 2-4 GM/100ML-% IV SOLN
2.0000 g | INTRAVENOUS | Status: AC
  Administered 2015-07-09: 2 g via INTRAVENOUS

## 2015-07-09 MED ORDER — KETOROLAC TROMETHAMINE 30 MG/ML IJ SOLN: 30.0000 mg | Freq: Four times a day (QID) | INTRAMUSCULAR | Status: DC

## 2015-07-09 MED ORDER — DOCUSATE SODIUM 100 MG PO CAPS
100.0000 mg | ORAL_CAPSULE | Freq: Two times a day (BID) | ORAL | Status: DC
  Administered 2015-07-09 – 2015-07-10 (×2): 100 mg via ORAL
  Filled 2015-07-09 (×2): qty 1

## 2015-07-09 MED ORDER — MIDAZOLAM HCL 2 MG/2ML IJ SOLN
INTRAMUSCULAR | Status: DC | PRN
  Administered 2015-07-09 (×2): 1 mg via INTRAVENOUS

## 2015-07-09 MED ORDER — OXYCODONE-ACETAMINOPHEN 5-325 MG PO TABS
1.0000 | ORAL_TABLET | ORAL | Status: DC | PRN
  Administered 2015-07-09 – 2015-07-10 (×2): 1 via ORAL
  Filled 2015-07-09: qty 1
  Filled 2015-07-09: qty 2

## 2015-07-09 MED ORDER — MENTHOL 3 MG MT LOZG: 1.0000 | LOZENGE | OROMUCOSAL | Status: DC | PRN

## 2015-07-09 MED ORDER — HYDROMORPHONE HCL 1 MG/ML IJ SOLN
0.2500 mg | INTRAMUSCULAR | Status: DC | PRN
  Administered 2015-07-09 (×3): 0.5 mg via INTRAVENOUS

## 2015-07-09 MED ORDER — SCOPOLAMINE 1 MG/3DAYS TD PT72
1.0000 | MEDICATED_PATCH | Freq: Once | TRANSDERMAL | Status: DC
  Administered 2015-07-09: 1.5 mg via TRANSDERMAL

## 2015-07-09 MED ORDER — LACTATED RINGERS IV SOLN
INTRAVENOUS | Status: DC
  Administered 2015-07-09 (×2): via INTRAVENOUS

## 2015-07-09 MED ORDER — LACTATED RINGERS IV SOLN
INTRAVENOUS | Status: DC
  Administered 2015-07-09: 10:00:00 via INTRAVENOUS

## 2015-07-09 MED ORDER — ROCURONIUM BROMIDE 100 MG/10ML IV SOLN
INTRAVENOUS | Status: AC
  Filled 2015-07-09: qty 1

## 2015-07-09 MED ORDER — PROCHLORPERAZINE EDISYLATE 5 MG/ML IJ SOLN: 10.0000 mg | Freq: Once | INTRAMUSCULAR | Status: DC

## 2015-07-09 MED ORDER — HYDROMORPHONE HCL 1 MG/ML IJ SOLN: 0.2000 mg | INTRAMUSCULAR | Status: DC | PRN

## 2015-07-09 MED ORDER — SIMETHICONE 80 MG PO CHEW: 80.0000 mg | CHEWABLE_TABLET | Freq: Four times a day (QID) | ORAL | Status: DC | PRN

## 2015-07-09 MED ORDER — SUGAMMADEX SODIUM 200 MG/2ML IV SOLN
INTRAVENOUS | Status: AC
  Filled 2015-07-09: qty 2

## 2015-07-09 SURGICAL SUPPLY — 30 items
BLADE SURG 10 STRL SS (BLADE) ×6 IMPLANT
CANISTER SUCT 3000ML (MISCELLANEOUS) ×3 IMPLANT
CLOTH BEACON ORANGE TIMEOUT ST (SAFETY) ×3 IMPLANT
CONT PATH 16OZ SNAP LID 3702 (MISCELLANEOUS) IMPLANT
CONTAINER PREFILL 10% NBF 60ML (FORM) ×3 IMPLANT
DRAPE WARM FLUID 44X44 (DRAPE) IMPLANT
DRSG OPSITE POSTOP 4X10 (GAUZE/BANDAGES/DRESSINGS) ×3 IMPLANT
DURAPREP 26ML APPLICATOR (WOUND CARE) ×6 IMPLANT
GAUZE SPONGE 4X4 16PLY XRAY LF (GAUZE/BANDAGES/DRESSINGS) IMPLANT
GLOVE BIO SURGEON STRL SZ 6 (GLOVE) ×3 IMPLANT
GLOVE BIOGEL PI IND STRL 6 (GLOVE) ×2 IMPLANT
GLOVE BIOGEL PI IND STRL 7.0 (GLOVE) ×1 IMPLANT
GLOVE BIOGEL PI INDICATOR 6 (GLOVE) ×4
GLOVE BIOGEL PI INDICATOR 7.0 (GLOVE) ×2
GOWN STRL REUS W/TWL LRG LVL3 (GOWN DISPOSABLE) ×6 IMPLANT
NS IRRIG 1000ML POUR BTL (IV SOLUTION) ×3 IMPLANT
PACK ABDOMINAL GYN (CUSTOM PROCEDURE TRAY) ×3 IMPLANT
PAD OB MATERNITY 4.3X12.25 (PERSONAL CARE ITEMS) ×3 IMPLANT
PROTECTOR NERVE ULNAR (MISCELLANEOUS) ×3 IMPLANT
SPONGE LAP 18X18 X RAY DECT (DISPOSABLE) IMPLANT
STAPLER VISISTAT 35W (STAPLE) IMPLANT
SUT MON AB-0 CT1 36 (SUTURE) ×3 IMPLANT
SUT PLAIN 2 0 XLH (SUTURE) ×3 IMPLANT
SUT VIC AB 0 CT1 18XCR BRD8 (SUTURE) ×1 IMPLANT
SUT VIC AB 0 CT1 27 (SUTURE) ×2
SUT VIC AB 0 CT1 27XBRD ANBCTR (SUTURE) ×1 IMPLANT
SUT VIC AB 0 CT1 8-18 (SUTURE) ×2
TOWEL OR 17X24 6PK STRL BLUE (TOWEL DISPOSABLE) ×6 IMPLANT
TRAY FOLEY CATH SILVER 14FR (SET/KITS/TRAYS/PACK) ×3 IMPLANT
WATER STERILE IRR 1000ML POUR (IV SOLUTION) IMPLANT

## 2015-07-09 NOTE — Op Note (Signed)
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 Linda Hedges OPERATION:  Laparotomy, right oophorectomy ANESTHESIA:  General endotracheal.  INDICATIONS: The patient is a 30 y.o. [id number] with history of right adnexal mass suspicious for dermoid.  The mass has been followed approximately two years and was identified incidentally on obstetric u/s.  A large portion of the cyst was felt to be solid so the decision was made to remove through laparotomy.  On the preoperative visit, the risks, benefits, indications, and alternatives of the procedure were reviewed with the patient.  On the day of surgery, the risks of surgery were again discussed with the patient including but not limited to: bleeding which may require transfusion or reoperation; infection which may require antibiotics; injury to bowel, bladder, ureters or other surrounding organs; need for additional procedures; thromboembolic phenomenon, incisional problems and other postoperative/anesthesia complications. Written informed consent was obtained.    OPERATIVE FINDINGS:  Small, externally normal uterus, normal left fallopian tube and ovary.  Enlarged right ovary with no clear ovary/cyst interface.  Normal right fallopian tube.   ESTIMATED BLOOD LOSS: 50cc SPECIMENS:  Right ovary sent to pathology COMPLICATIONS:  None immediate.   DESCRIPTION OF PROCEDURE: The patient received intravenous antibiotics and had sequential compression devices applied to her lower extremities while in the preoperative area.   She was taken to the operating room and placed under general anesthesia without difficulty and found to be adequate.The abdomen and perineum were prepped and draped in a sterile manner, and a Foley catheter was inserted into the bladder and attached to constant drainage. After an adequate timeout was performed, a mini-laparotomy skin incision  was made. This incision was taken down to the fascia using electrocautery with care given to maintain good hemostasis. The fascia was incised in the midline and the fascial incision was then extended bilaterally using electrocautery without difficulty. The fascia was then dissected off the underlying rectus muscles using blunt and sharp dissection. The rectus muscles were split bluntly in the midline and the peritoneum entered sharply without complication. This peritoneal incision was then extended superiorly and inferiorly with care given to prevent bowel or bladder injury. Upon entry into the abdominal cavity, the upper abdomen was inspected and found to be normal. Attention was then turned to the pelvis. The above dictated findings were noted.  The enlarged right ovary was elevated out of the abdomen.  Using Haney clamps, the IP was clamped, cut, and suture ligated.  Serial clamp, cut and suture ligation was performed until the ovary was freed without cyst rupture being mindful of the fallopian tube.  Inspection of the pedicle revealed hemostasis.  All laparotomy sponges and instruments were removed from the abdomen. The peritoneum was closed with 2-0 Monocryl, and the fascia was closed with 0 Vicryl in a running fashion. The subcutaneous layer was reapproximated with 2-0 plain gut. The skin was closed with a 4-0 Monocryl subcuticular stitch. Sponge, lap, needle, and instrument counts were correct times two. The patient was taken to the recovery area awake, extubated and in stable condition.

## 2015-07-09 NOTE — Anesthesia Postprocedure Evaluation (Signed)
Anesthesia Post Note  Patient: Erika Howe  Procedure(s) Performed: Procedure(s) (LRB): LAPAROTOMY (Right) OOPHORECTOMY (Right)  Patient location during evaluation: [facility] Anesthesia Type: General Level of consciousness: awake and alert and oriented Pain management: pain level controlled Vital Signs Assessment: post-procedure vital signs reviewed and stable Respiratory status: spontaneous breathing, nonlabored ventilation and respiratory function stable Cardiovascular status: stable Postop Assessment: no signs of nausea or vomiting and no headache Anesthetic complications: no     Last Vitals:  Filed Vitals:   07/09/15 1031 07/09/15 1307  BP: 138/82 138/82  Pulse: 69   Temp: 36.6 C 36.8 C  Resp: 16 18    Last Pain:  Filed Vitals:   07/09/15 1315  PainSc: 4    Pain Goal: Patients Stated Pain Goal: 3 (07/09/15 1307)               Ethie Curless

## 2015-07-09 NOTE — Progress Notes (Signed)
No change to H&P.  Sarit Sparano, DO 

## 2015-07-09 NOTE — Anesthesia Postprocedure Evaluation (Signed)
Anesthesia Post Note  Patient: Erika Howe  Procedure(s) Performed: Procedure(s) (LRB): LAPAROTOMY (Right) OOPHORECTOMY (Right)  Patient location during evaluation: PACU Anesthesia Type: General Level of consciousness: awake and alert Pain management: pain level controlled Vital Signs Assessment: post-procedure vital signs reviewed and stable Respiratory status: spontaneous breathing, nonlabored ventilation, respiratory function stable and patient connected to nasal cannula oxygen Cardiovascular status: blood pressure returned to baseline and stable Postop Assessment: no signs of nausea or vomiting Anesthetic complications: no    Last Vitals:  Filed Vitals:   07/09/15 0830 07/09/15 0845  BP: 148/95 152/93  Pulse: 88 77  Temp:    Resp: 16 18    Last Pain:  Filed Vitals:   07/09/15 0900  PainSc: 5                  Kamarian Sahakian S

## 2015-07-09 NOTE — Addendum Note (Signed)
Addendum  created 07/09/15 1450 by Elenore Paddy, CRNA   Modules edited: Clinical Notes   Clinical Notes:  File: [id number]

## 2015-07-09 NOTE — Transfer of Care (Signed)
Immediate Anesthesia Transfer of Care Note  Patient: Erika Howe  Procedure(s) Performed: Procedure(s) with comments: LAPAROTOMY (Right) - right ovarian cyst removal.  OOPHORECTOMY (Right) - possible  Patient Location: PACU  Anesthesia Type:General  Level of Consciousness: awake, alert , oriented and patient cooperative  Airway & Oxygen Therapy: Patient Spontanous Breathing and Patient connected to nasal cannula oxygen  Post-op Assessment: Report given to RN and Post -op Vital signs reviewed and stable  Post vital signs: Reviewed and stable  Last Vitals:  Filed Vitals:   07/09/15 0646  BP: 135/89  Pulse: 95  Temp: 36.9 C  Resp: 16    Last Pain: There were no vitals filed for this visit.    Patients Stated Pain Goal: 3 ([id number])  Complications: No apparent anesthesia complications

## 2015-07-09 NOTE — Anesthesia Procedure Notes (Signed)
Date/Time: 07/09/2015 7:28 AM Performed by: Tobin Chad Pre-anesthesia Checklist: Patient identified, Patient being monitored, Emergency Drugs available, Timeout performed and Suction available Patient Re-evaluated:Patient Re-evaluated prior to inductionOxygen Delivery Method: Circle system utilized and Simple face mask Preoxygenation: Pre-oxygenation with 100% oxygen Intubation Type: IV induction and Inhalational induction Ventilation: Mask ventilation without difficulty and Mask ventilation with difficulty Laryngoscope Size: Mac and 3 Grade View: Grade III Tube type: Oral Tube size: 7.0 mm Number of attempts: 1 Airway Equipment and Method: Stylet Placement Confirmation: ETT inserted through vocal cords under direct vision,  breath sounds checked- equal and bilateral and positive ETCO2 Secured at: 21 cm Tube secured with: Tape Dental Injury: Teeth and Oropharynx as per pre-operative assessment

## 2015-07-09 NOTE — Progress Notes (Signed)
NOS  No current c/o.  Tolerating clears.  Voiding without difficulty.  No n/v.  Pain well controlled.  No CP/SOB.   VSS. AF. UOP adequate.  Gen: A&O x 3 Abd: soft, ND, honeycomb c/d/i Ext: no c/c/e  POD#0 s/p laparotomy, right oophorectomy -Continue pain and nausea control -Advance diet -Ambulate -D/C in AM.  Linda Hedges, DO

## 2015-07-10 ENCOUNTER — Encounter (HOSPITAL_COMMUNITY): Payer: Self-pay | Admitting: Obstetrics & Gynecology

## 2015-07-10 DIAGNOSIS — D270 Benign neoplasm of right ovary: Secondary | ICD-10-CM | POA: Diagnosis not present

## 2015-07-10 LAB — CBC
HCT: 33.9 % — ABNORMAL LOW (ref 36.0–46.0)
HEMOGLOBIN: 11.2 g/dL — AB (ref 12.0–15.0)
MCH: 27.7 pg (ref 26.0–34.0)
MCHC: 33 g/dL (ref 30.0–36.0)
MCV: 83.9 fL (ref 78.0–100.0)
PLATELETS: 244 10*3/uL (ref 150–400)
RBC: 4.04 MIL/uL (ref 3.87–5.11)
RDW: 14.1 % (ref 11.5–15.5)
WBC: 12 10*3/uL — ABNORMAL HIGH (ref 4.0–10.5)

## 2015-07-10 MED ORDER — IBUPROFEN 800 MG PO TABS: 800.0000 mg | ORAL_TABLET | Freq: Four times a day (QID) | ORAL | Status: DC | PRN

## 2015-07-10 MED ORDER — DOCUSATE SODIUM 100 MG PO CAPS: 100.0000 mg | ORAL_CAPSULE | Freq: Two times a day (BID) | ORAL | Status: DC

## 2015-07-10 MED ORDER — OXYCODONE-ACETAMINOPHEN 5-325 MG PO TABS: 1.0000 | ORAL_TABLET | ORAL | Status: DC | PRN

## 2015-07-10 NOTE — Discharge Summary (Signed)
Physician Discharge Summary  Patient ID: Erika Howe MRN: [id number] DOB/AGE: 1985/10/15 30 y.o.  Admit date: 07/09/2015 Discharge date: 07/10/2015  Admission Diagnoses: Right adnexal mass  Discharge Diagnoses: SAA Active Problems:   S/P right oophorectomy   Discharged Condition: good  Hospital Course: Patient was admitted for anticipated laparotomy for right oophorectomy.  She underwent the anticipated procedure without complications.  She did extremely well postop and on POD1 was meeting all goals.  She was discharged home with planned office f/u.   Consults: None  Significant Diagnostic Studies: labs: Hgb  Treatments: surgery: Laparotomy, right oophorectomy  Discharge Exam: Blood pressure 102/58, pulse 52, temperature 97.9 F (36.6 C), temperature source Oral, resp. rate 18, height 5\' 6"  (1.676 m), weight 170 lb (77.111 kg), last menstrual period 06/29/2015, SpO2 95 %, currently breastfeeding. General appearance: alert, cooperative and appears stated age GI: abnormal findings:  approp ttp Extremities: extremities normal, atraumatic, no cyanosis or edema Incision/Wound:c/d/i  Disposition: 01-Home or Self Care     Medication List    TAKE these medications        acetaminophen 500 MG tablet  Commonly known as:  TYLENOL  Take 500-1,000 mg by mouth every 6 (six) hours as needed for mild pain.     docusate sodium 100 MG capsule  Commonly known as:  COLACE  Take 1 capsule (100 mg total) by mouth 2 (two) times daily.     ibuprofen 800 MG tablet  Commonly known as:  ADVIL  Take 1 tablet (800 mg total) by mouth every 6 (six) hours as needed.     oxyCODONE-acetaminophen 5-325 MG tablet  Commonly known as:  PERCOCET/ROXICET  Take 1-2 tablets by mouth every 4 (four) hours as needed (moderate to severe pain (when tolerating fluids)).     prenatal multivitamin Tabs tablet  Take 1 tablet by mouth daily.         SignedLinda Hedges 07/10/2015, 8:28 AM

## 2015-07-10 NOTE — Progress Notes (Signed)
1 Day Post-Op Procedure(s) (LRB): LAPAROTOMY (Right) OOPHORECTOMY (Right)  Subjective: Patient reports tolerating PO, + flatus and no problems voiding.    Objective: I have reviewed patient's vital signs, intake and output, medications and labs.  General: alert, cooperative and appears stated age GI: abnormal findings:  appropriately ttp and incision: clean and dry Extremities: extremities normal, atraumatic, no cyanosis or edema  Assessment: s/p Procedure(s) with comments: LAPAROTOMY (Right) - right ovarian cyst removal.  OOPHORECTOMY (Right) - possible: stable  Plan: Discharge home     [place], [place] 07/10/2015, 8:25 AM

## 2015-07-10 NOTE — Progress Notes (Signed)
Assumed care from Blain Pais, RN.

## 2015-07-10 NOTE — Discharge Instructions (Signed)
Call MD for T>100.4, severe abdominal pain, intractable nausea and/or vomiting, or respiratory distress.  Call office to schedule postop appointment in 2 weeks.  No driving while taking narcotics. 

## 2015-08-03 ENCOUNTER — Other Ambulatory Visit: Payer: 59

## 2016-06-10 ENCOUNTER — Inpatient Hospital Stay (HOSPITAL_COMMUNITY): Payer: 59

## 2016-06-10 ENCOUNTER — Inpatient Hospital Stay (HOSPITAL_COMMUNITY)
Admission: AD | Admit: 2016-06-10 | Discharge: 2016-06-10 | Disposition: A | Discharge status: Home / Self Care | Payer: 59 | Source: Ambulatory Visit | Attending: Obstetrics & Gynecology | Admitting: Obstetrics & Gynecology

## 2016-06-10 ENCOUNTER — Encounter (HOSPITAL_COMMUNITY): Payer: Self-pay

## 2016-06-10 DIAGNOSIS — O4690 Antepartum hemorrhage, unspecified, unspecified trimester: Secondary | ICD-10-CM

## 2016-06-10 DIAGNOSIS — Z818 Family history of other mental and behavioral disorders: Secondary | ICD-10-CM | POA: Diagnosis not present

## 2016-06-10 DIAGNOSIS — Z3A01 Less than 8 weeks gestation of pregnancy: Secondary | ICD-10-CM | POA: Diagnosis not present

## 2016-06-10 DIAGNOSIS — O009 Unspecified ectopic pregnancy without intrauterine pregnancy: Secondary | ICD-10-CM | POA: Insufficient documentation

## 2016-06-10 DIAGNOSIS — O00102 Left tubal pregnancy without intrauterine pregnancy: Secondary | ICD-10-CM

## 2016-06-10 DIAGNOSIS — N939 Abnormal uterine and vaginal bleeding, unspecified: Secondary | ICD-10-CM | POA: Diagnosis present

## 2016-06-10 DIAGNOSIS — Z881 Allergy status to other antibiotic agents status: Secondary | ICD-10-CM | POA: Insufficient documentation

## 2016-06-10 DIAGNOSIS — Z90721 Acquired absence of ovaries, unilateral: Secondary | ICD-10-CM | POA: Diagnosis not present

## 2016-06-10 LAB — URINALYSIS, ROUTINE W REFLEX MICROSCOPIC
BILIRUBIN URINE: NEGATIVE
GLUCOSE, UA: NEGATIVE mg/dL
KETONES UR: NEGATIVE mg/dL
LEUKOCYTES UA: NEGATIVE
NITRITE: NEGATIVE
PH: 5 (ref 5.0–8.0)
PROTEIN: NEGATIVE mg/dL
Specific Gravity, Urine: 1.026 (ref 1.005–1.030)

## 2016-06-10 LAB — CBC
HCT: 37.2 % (ref 36.0–46.0)
Hemoglobin: 12.1 g/dL (ref 12.0–15.0)
MCH: 27.8 pg (ref 26.0–34.0)
MCHC: 32.5 g/dL (ref 30.0–36.0)
MCV: 85.3 fL (ref 78.0–100.0)
Platelets: 242 10*3/uL (ref 150–400)
RBC: 4.36 MIL/uL (ref 3.87–5.11)
RDW: 14.7 % (ref 11.5–15.5)
WBC: 9.2 10*3/uL (ref 4.0–10.5)

## 2016-06-10 LAB — TYPE AND SCREEN
ABO/RH(D): B POS
ANTIBODY SCREEN: NEGATIVE

## 2016-06-10 LAB — WET PREP, GENITAL
Clue Cells Wet Prep HPF POC: NONE SEEN
Sperm: NONE SEEN
Trich, Wet Prep: NONE SEEN
Yeast Wet Prep HPF POC: NONE SEEN

## 2016-06-10 LAB — HCG, QUANTITATIVE, PREGNANCY: HCG, BETA CHAIN, QUANT, S: 8858 m[IU]/mL — AB (ref <5)

## 2016-06-10 LAB — POCT PREGNANCY, URINE: Preg Test, Ur: POSITIVE — AB

## 2016-06-10 LAB — CREATININE, SERUM
CREATININE: 0.64 mg/dL (ref 0.44–1.00)
GFR calc Af Amer: >60 mL/min (ref >60)
GFR calc non Af Amer: >60 mL/min (ref >60)

## 2016-06-10 LAB — AST: AST: 14 U/L — ABNORMAL LOW (ref 15–41)

## 2016-06-10 LAB — BUN: BUN: 13 mg/dL (ref 6–20)

## 2016-06-10 MED ORDER — METHOTREXATE INJECTION FOR WOMEN'S HOSPITAL
50.0000 mg/m2 | Freq: Once | INTRAMUSCULAR | Status: AC
  Administered 2016-06-10: 120 mg via INTRAMUSCULAR
  Filled 2016-06-10: qty 2.4

## 2016-06-10 NOTE — Discharge Instructions (Signed)
Methotrexate Treatment for an Ectopic Pregnancy, Care After °Refer to this sheet in the next few weeks. These instructions provide you with information on caring for yourself after your procedure. Your health care provider may also give you more specific instructions. Your treatment has been planned according to current medical practices, but problems sometimes occur. Call your health care provider if you have any problems or questions after your procedure. °What can I expect after the procedure? °You may have some abdominal cramping, vaginal bleeding, and fatigue in the first few days after taking methotrexate. Some other possible side effects of methotrexate include: °· Nausea. °· Vomiting. °· Diarrhea. °· Mouth sores. °· Swelling or irritation of the lining of your lungs (pneumonitis). °· Liver damage. °· Hair loss. ° °Follow these instructions at home: °After you have received the methotrexate medicine, you need to be careful of your activities and watch your condition for several weeks. It may take 1 week before your hormone levels return to normal. °Activity °· Do not have sexual intercourse until your health care provider says it is safe to do so. °· You may resume your usual diet. °· Limit strenuous activity. °· Do not drink alcohol. °General instructions °· Do not take aspirin, ibuprofen, or naproxen (nonsteroidal anti-inflammatory drugs [NSAIDs]). °· Do not take folic acid, prenatal vitamins, or other vitamins that contain folic acid. °· Avoid traveling too far away from your health care provider. °· Keep all follow-up visits as told by your health care provider. This is important. °Contact a health care provider if: °· You cannot control your nausea and vomiting. °· You cannot control your diarrhea. °· You have sores in your mouth and want treatment. °· You need pain medicine for your abdominal pain. °· You have a rash. °· You are having a reaction to the medicine. °Get help right away if: °· You have  increasing abdominal or pelvic pain. °· You notice increased bleeding. °· You feel light-headed, or you faint. °· You have shortness of breath. °· Your heart rate increases. °· You have a cough. °· You have chills. °· You have a fever. °This information is not intended to replace advice given to you by your health care provider. Make sure you discuss any questions you have with your health care provider. °Document Released: 01/13/2011 Document Revised: 07/02/2015 Document Reviewed: 11/12/2012 °Elsevier Interactive Patient Education © 2017 [employer] ° °

## 2016-06-10 NOTE — MAU Provider Note (Signed)
History     CSN: [id number]  Arrival date and time: 06/10/16 9323   First Provider Initiated Contact with Patient 06/10/16 [phone]      Chief Complaint  Patient presents with  . Vaginal Bleeding   G5P1031 @[redacted]w[redacted]d  by sure LMP here with VB. Bleeding started about 1 week ago after IC, it tapered off after a day. Bleeding returned 2 days ago, again after IC, continued into yesterday then tapered. Bleeding was initially red then became brown. She reports brown spotting this am. She reports intermittent cramping since last week. Has not used anything for it. She was seen in the office about 1.5 wks ago and had normally rising quants but reports low progesterone level and is supplementing. She also admits to a brown malodorous discharge a few weeks ago when she learned of her pregnancy. She reports quant HCG was about 1000, 1.5 wks ago.   OB History    Gravida Para Term Preterm AB Living   5 1 1   3 1    SAB TAB Ectopic Multiple Live Births     3   0 1      Past Medical History:  Diagnosis Date  . Localized swelling of both lower legs   . Medical history non-contributory   . PONV (postoperative nausea and vomiting)     Past Surgical History:  Procedure Laterality Date  . INCISIONAL BREAST BIOPSY Right   . LAPAROTOMY Right 07/09/2015   Procedure: LAPAROTOMY;  Surgeon: Linda Hedges, DO;  Location: [facility];  Service: Gynecology;  Laterality: Right;  right ovarian cyst removal.   . OOPHORECTOMY Right 07/09/2015   Procedure: OOPHORECTOMY;  Surgeon: Linda Hedges, DO;  Location: [facility];  Service: Gynecology;  Laterality: Right;  possible    Family History  Problem Relation Age of Onset  . Mental illness Mother     Social History  Substance Use Topics  . Smoking status: Never Smoker  . Smokeless tobacco: Never Used  . Alcohol use Yes     Comment: occasional    Allergies:  Allergies  Allergen Reactions  . Doxycycline Nausea And Vomiting    Prescriptions Prior to Admission  Medication  Sig Dispense Refill Last Dose  . acetaminophen (TYLENOL) 500 MG tablet Take 500-1,000 mg by mouth every 6 (six) hours as needed for mild pain.   Past Week at Unknown time  . Prenatal Vit-Fe Fumarate-FA (PRENATAL MULTIVITAMIN) TABS tablet Take 1 tablet by mouth daily.   06/09/2016 at Unknown time  . progesterone (PROMETRIUM) 200 MG capsule Take 200 mg by mouth daily.   06/09/2016 at Unknown time  . docusate sodium (COLACE) 100 MG capsule Take 1 capsule (100 mg total) by mouth 2 (two) times daily. 10 capsule 0   . ibuprofen (ADVIL,MOTRIN) 800 MG tablet Take 1 tablet (800 mg total) by mouth every 6 (six) hours as needed. 30 tablet 1   . oxyCODONE-acetaminophen (PERCOCET/ROXICET) 5-325 MG tablet Take 1-2 tablets by mouth every 4 (four) hours as needed (moderate to severe pain (when tolerating fluids)). 30 tablet 0     Review of Systems  Constitutional: Negative for chills and fever.  Gastrointestinal: Positive for abdominal pain (cramping). Negative for constipation, diarrhea, nausea and vomiting.  Genitourinary: Positive for vaginal bleeding and vaginal discharge.   Physical Exam   Blood pressure (!) 143/75, pulse 90, temperature 98.8 F (37.1 C), temperature source Oral, resp. rate 16, height 5\' 6"  (1.676 m), weight 119.3 kg (263 lb), last menstrual period 04/27/2016, currently breastfeeding.  Physical Exam  Nursing note and vitals reviewed. Constitutional: She is oriented to person, place, and time. She appears well-developed and well-nourished. No distress (appears comfortable).  HENT:  Head: Normocephalic and atraumatic.  Neck: Normal range of motion. Neck supple.  Cardiovascular: Normal rate.   Respiratory: Effort normal.  GI: Soft. She exhibits no distension and no mass. There is no tenderness. There is no rebound and no guarding.  Genitourinary:  Genitourinary Comments: External: no lesions or erythema Vagina: rugated, parous, scant brown discharge Uterus: non enlarged, anteverted, +  tender, no CMT Adnexae: no masses, no tenderness left, no tenderness right   Musculoskeletal: Normal range of motion.  Neurological: She is alert and oriented to person, place, and time.  Skin: Skin is warm and dry.  Psychiatric: She has a normal mood and affect.   Results for orders placed or performed during the hospital encounter of 06/10/16 (from the past 24 hour(s))  Urinalysis, Routine w reflex microscopic     Status: Abnormal   Collection Time: 06/10/16  8:10 AM  Result Value Ref Range   Color, Urine YELLOW YELLOW   APPearance HAZY (A) CLEAR   Specific Gravity, Urine 1.026 1.005 - 1.030   pH 5.0 5.0 - 8.0   Glucose, UA NEGATIVE NEGATIVE mg/dL   Hgb urine dipstick MODERATE (A) NEGATIVE   Bilirubin Urine NEGATIVE NEGATIVE   Ketones, ur NEGATIVE NEGATIVE mg/dL   Protein, ur NEGATIVE NEGATIVE mg/dL   Nitrite NEGATIVE NEGATIVE   Leukocytes, UA NEGATIVE NEGATIVE   RBC / HPF 0-5 0 - 5 RBC/hpf   WBC, UA 0-5 0 - 5 WBC/hpf   Bacteria, UA RARE (A) NONE SEEN   Squamous Epithelial / LPF 6-30 (A) NONE SEEN   Mucous PRESENT   Pregnancy, urine POC     Status: Abnormal   Collection Time: 06/10/16  8:17 AM  Result Value Ref Range   Preg Test, Ur POSITIVE (A) NEGATIVE  CBC     Status: None   Collection Time: 06/10/16  8:44 AM  Result Value Ref Range   WBC 9.2 4.0 - 10.5 K/uL   RBC 4.36 3.87 - 5.11 MIL/uL   Hemoglobin 12.1 12.0 - 15.0 g/dL   HCT 37.2 36.0 - 46.0 %   MCV 85.3 78.0 - 100.0 fL   MCH 27.8 26.0 - 34.0 pg   MCHC 32.5 30.0 - 36.0 g/dL   RDW 14.7 11.5 - 15.5 %   Platelets 242 150 - 400 K/uL  hCG, quantitative, pregnancy     Status: Abnormal   Collection Time: 06/10/16  8:44 AM  Result Value Ref Range   hCG, Beta Chain, Quant, S 8,858 (H) <5 mIU/mL  Wet prep, genital     Status: Abnormal   Collection Time: 06/10/16  9:05 AM  Result Value Ref Range   Yeast Wet Prep HPF POC NONE SEEN NONE SEEN   Trich, Wet Prep NONE SEEN NONE SEEN   Clue Cells Wet Prep HPF POC NONE SEEN  NONE SEEN   WBC, Wet Prep HPF POC TOO NUMEROUS TO COUNT (A) NONE SEEN   Sperm NONE SEEN    US Ob Comp Less 14 Wks  Result Date: 06/10/2016 CLINICAL DATA:  Cramping and vaginal bleeding for 1 week. Gestational age by LMP of 6 weeks 2 days. Previous right oophorectomy . EXAM: OBSTETRIC <14 WK [place] AND TRANSVAGINAL OB US TECHNIQUE: Both transabdominal and transvaginal ultrasound examinations were performed for complete evaluation of the gestation as well as the maternal uterus, adnexal  regions, and pelvic cul-de-sac. Transvaginal technique was performed to assess early pregnancy. COMPARISON:  None. FINDINGS: Intrauterine gestational sac: None ; small amount of fluid noted in the endometrial cavity Maternal uterus/adnexae: An ectopic gestational sac containing a yolk sac is seen within the left adnexa separate from the left ovary. This measures 2.7 x 2.1 x 2.2 cm. No evidence of free fluid. Left ovary contains a corpus luteum. Right ovary not visualized consistent with history of prior right oophorectomy. No right adnexal mass identified. IMPRESSION: Ectopic pregnancy in left adnexa, measuring 2.7 x 2.1 x 2.2 cm. No evidence of hemoperitoneum. Critical Value/emergent results were called by telephone at the time of interpretation on 06/10/2016 at 10:33 am to patient's caretaker in MAU, [facility] , who verbally acknowledged these results. Electronically Signed   By: Earle Gell M.D.   On: 06/10/2016 10:38   US Ob Transvaginal  Result Date: 06/10/2016 CLINICAL DATA:  Cramping and vaginal bleeding for 1 week. Gestational age by LMP of 6 weeks 2 days. Previous right oophorectomy . EXAM: OBSTETRIC <14 WK [place] AND TRANSVAGINAL OB US TECHNIQUE: Both transabdominal and transvaginal ultrasound examinations were performed for complete evaluation of the gestation as well as the maternal uterus, adnexal regions, and pelvic cul-de-sac. Transvaginal technique was performed to assess early pregnancy. COMPARISON:  None.  FINDINGS: Intrauterine gestational sac: None ; small amount of fluid noted in the endometrial cavity Maternal uterus/adnexae: An ectopic gestational sac containing a yolk sac is seen within the left adnexa separate from the left ovary. This measures 2.7 x 2.1 x 2.2 cm. No evidence of free fluid. Left ovary contains a corpus luteum. Right ovary not visualized consistent with history of prior right oophorectomy. No right adnexal mass identified. IMPRESSION: Ectopic pregnancy in left adnexa, measuring 2.7 x 2.1 x 2.2 cm. No evidence of hemoperitoneum. Critical Value/emergent results were called by telephone at the time of interpretation on 06/10/2016 at 10:33 am to patient's caretaker in MAU, [facility] , who verbally acknowledged these results. Electronically Signed   By: Earle Gell M.D.   On: 06/10/2016 10:38   MAU Course  Procedures  MDM Labs and [place] ordered and reviewed. US findings consistent with ectopic pregnancy. Presentation, clinical findings, and plan discussed with Dr. Pamala Hurry. Dr. Pamala Hurry will come discuss findings and mngt with pt.  Assessment and Plan  Ectopic pregnancy  MTX management Discharge home Follow up in office in 4 days Return precautions  Julianne Handler, CNM 06/10/2016, 9:10 AM

## 2016-06-10 NOTE — Progress Notes (Signed)
Patient has had lab work at [facility] but no ultrasound

## 2016-06-10 NOTE — MAU Note (Signed)
Called lab to report add on labs.

## 2016-06-10 NOTE — MAU Note (Signed)
Patient presents with onset of vaginal bleeding x 1 week, had stopped then started back, now bleeding is brown, also having cramping.

## 2016-06-10 NOTE — H&P (Signed)
Chief complaint: Left adnexal pain and scant vaginal bleeding  History of present illness: 31 year old G5 P1031 here for evaluation in early pregnancy. Patient has history of term spontaneous vaginal delivery followed by exploratory laparotomy and removal of right dermoid ovary. Prior to this patient had termination of pregnancy 3. 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 Patient reports normal cycles. She was given ovulation induction and recently did Ovidrel after normal left ovarian follicular development. She reported intense pain 2 days back with some moderate vaginal bleeding. Her pain was resolved with over-the-counter NSAIDs and she has not needed any additional medications. Since the episode 2 days ago patient reports no abdominal pain and no further bleeding. This is a desired pregnancy. Patient reports she is still intermittently breast-feeding her 69-1/2-year-old daughter but this is only at most once per evening.  Given the use of ovulation induction patient had beta hCG levels that were followed earlier in the pregnancy and patient was exhibiting a normal rise in hCG. When patient called about the pain 2 days ago she was instructed to present to the ER for evaluation. She is only now showing up today.  Past Medical History:  Diagnosis Date  . Localized swelling of both lower legs   . Medical history non-contributory   . PONV (postoperative nausea and vomiting)     Past Surgical History:  Procedure Laterality Date  . INCISIONAL BREAST BIOPSY Right   . LAPAROTOMY Right 07/09/2015   Procedure: LAPAROTOMY;  Surgeon: Linda Hedges, DO;  Location: [facility];  Service: Gynecology;  Laterality: Right;  right ovarian cyst removal.   . OOPHORECTOMY Right 07/09/2015   Procedure: OOPHORECTOMY;  Surgeon: Linda Hedges, DO;  Location: [facility];  Service: Gynecology;  Laterality: Right;  possible    Allergies: Doxycycline  Physical exam Vitals:   06/10/16 0808 06/10/16 0816  BP:   (!) 143/75  Pulse:  90  Resp:  16  Temp:  98.8 F (37.1 C)  TempSrc:  Oral  Weight: 263 lb (119.3 kg)   Height: 5\' 6"  (1.676 m)    Gen.: Well-appearing no distress Back: No costovertebral angle tenderness Abdomen: Obese, no pain on deep palpation, no rebound, no guarding GU: No right adnexal pain or masses, no chew cervical motion tenderness, no uterine tenderness, mild tenderness in the left adnexa though no masses palpated. Of note exam was limited by her habitus Lymphatic: No inguinal adenopathy Skin: Warm and dry Cardiovascular: Regular rate and rhythm Pulmonary: Clear to auscultation bilaterally Peripheral vascular: No lower extremity edema  Beta hCG: 8000 Blood type: Be positive Ultrasound: 2 x 2 by 2 cm left adnexal mass with ring of fire consistent with left ectopic pregnancy. No hemoperitoneum. Left corpus luteum cyst. Thickened endometrium, reactive. Absent right ovary  Assessment and plan: 31 year old G5 P1 1031 with new left-sided ectopic pregnancy. Patient is hemodynamically stable with a stable CBC. No evidence of active bleeding or impending tubal rupture. I discussed with patient various management options including surgical left salpingectomy which would have a very high cure rate and prevent risks of future ectopics on this side and also most efficiently treat the current ectopic pregnancy. The sooner we proceeded to the OR the less risk of tubal rupture. Patient is aware with delayed surgical management she would have higher rates of rupture which could complicate surgery especially in a patient who has already had pelvic surgery. The alternative strategy given her desire to preserve her fertility and given that she is hemodynamically stable with no  evidence of impending rupture would be methotrexate administration. Patient is aware she may experience some increased swelling after methotrexate administration, the toxic effects of methotrexate to liver, kidney and bone  marrow. We discussed that I would not recommend breast-feeding after the methotrexate and that she should stop breast-feeding her 74-1/2-year-old daughter. Discussed the trial [place] of methotrexate and the recommendation to avoid pregnancy for several months after methotrexate. Patient is aware that even if she clears the ectopic pregnancy with the methotrexate there is no guarantee that there won't be a further recommendation for salpingectomy for persistent tubal damage, to reduce further ectopic risk and for long-term fertility benefits. Patient's situation is complicated by the fact that she has a right oophorectomy and desiring fertility we discussed that even if she had a left salpingectomy leaving her with a left ovary but no left tube and a right tube but no right ovary that there still is a chance of pregnancy though we could predict natural fertility would be decreased. After fully discussing this with the patient and patient discussed with her husband patient would like to proceed with methotrexate. Patient is aware that there is risk of tubal rupture while methotrexate is working and the need for close outpatient follow-up. Patient will return to the office for labs on day 4 and a 7 and then weekly until the beta is less than 2. Patient will avoid pregnancy attempts until she is cleared by her primary OB/GYN. Patient will play close attention to any pain symptoms and present immediately with worsening abdominal pain.  Madisynn Plair A. 06/10/2016 12:43 PM

## 2016-06-13 LAB — GC/CHLAMYDIA PROBE AMP (~~LOC~~) NOT AT ARMC
Chlamydia: NEGATIVE
NEISSERIA GONORRHEA: NEGATIVE

## 2016-06-16 ENCOUNTER — Other Ambulatory Visit: Payer: Self-pay | Admitting: Obstetrics and Gynecology

## 2016-06-16 ENCOUNTER — Inpatient Hospital Stay (HOSPITAL_COMMUNITY)
Admission: AD | Admit: 2016-06-16 | Discharge: 2016-06-16 | Disposition: A | Discharge status: Home / Self Care | Payer: 59 | Source: Ambulatory Visit | Attending: Obstetrics and Gynecology | Admitting: Obstetrics and Gynecology

## 2016-06-16 DIAGNOSIS — Z3A00 Weeks of gestation of pregnancy not specified: Secondary | ICD-10-CM | POA: Diagnosis not present

## 2016-06-16 DIAGNOSIS — O009 Unspecified ectopic pregnancy without intrauterine pregnancy: Secondary | ICD-10-CM | POA: Insufficient documentation

## 2016-06-16 MED ORDER — METHOTREXATE INJECTION FOR WOMEN'S HOSPITAL
50.0000 mg/m2 | Freq: Once | INTRAMUSCULAR | Status: AC
  Administered 2016-06-16: 120 mg via INTRAMUSCULAR
  Filled 2016-06-16: qty 2.4

## 2016-06-16 MED ORDER — METHOTREXATE INJECTION FOR WOMEN'S HOSPITAL: 50.0000 mg/m2 | Freq: Once | INTRAMUSCULAR | Status: AC

## 2016-06-16 NOTE — MAU Note (Signed)
Pt here for second MTX injection for ectopic pregnancy

## 2016-06-22 ENCOUNTER — Encounter: Payer: Self-pay | Admitting: Gynecology

## 2017-04-26 ENCOUNTER — Encounter (HOSPITAL_COMMUNITY): Payer: Self-pay

## 2018-12-17 IMAGING — US US OB TRANSVAGINAL
1 series · 15 of 28 positions shown · non-contrast
Comparison: None.

CLINICAL DATA: Cramping and vaginal bleeding for 1 week.
Gestational age by LMP of 6 weeks 2 days. Previous right
oophorectomy .

EXAM:
OBSTETRIC <14 WK US AND TRANSVAGINAL OB US
TECHNIQUE: Both transabdominal and transvaginal ultrasound examinations were
performed for complete evaluation of the gestation as well as the
maternal uterus, adnexal regions, and pelvic cul-de-sac.
Transvaginal technique was performed to assess early pregnancy.

[Series 1: us ob transvaginal · 15 of 59 slices shown]
[im 1/59]
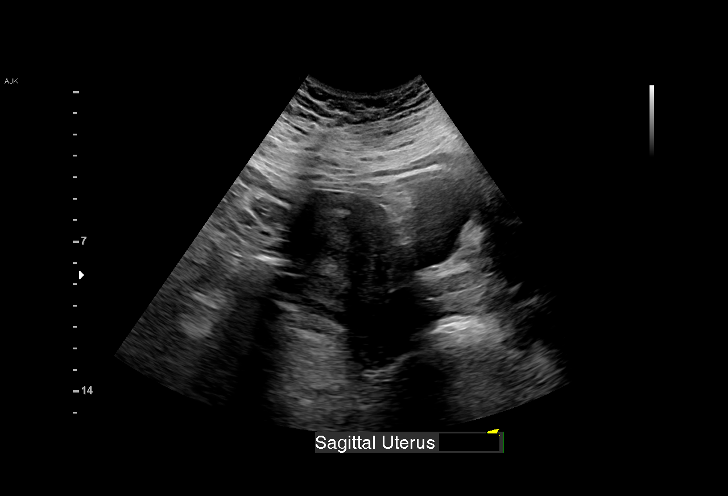
[im 5/59]
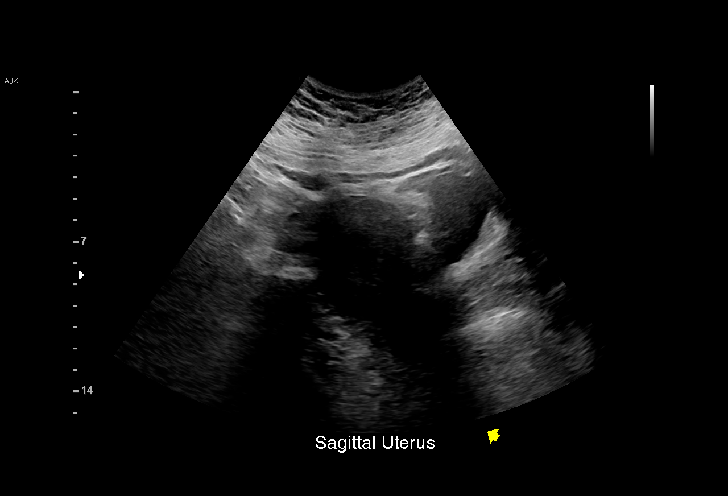
[im 9/59]
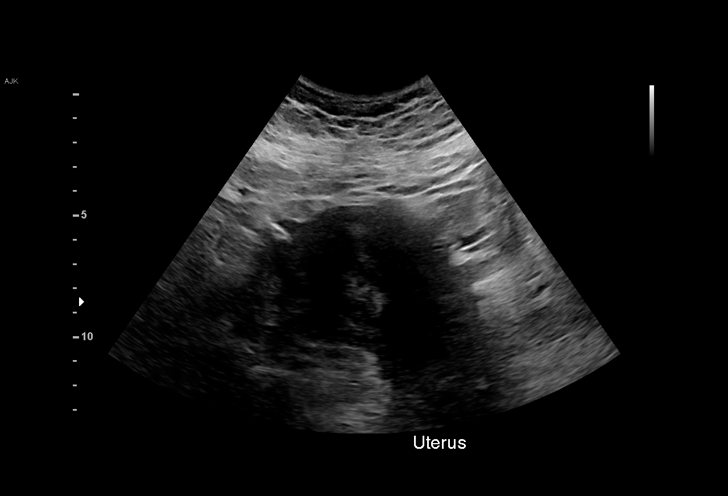
[im 13/59]
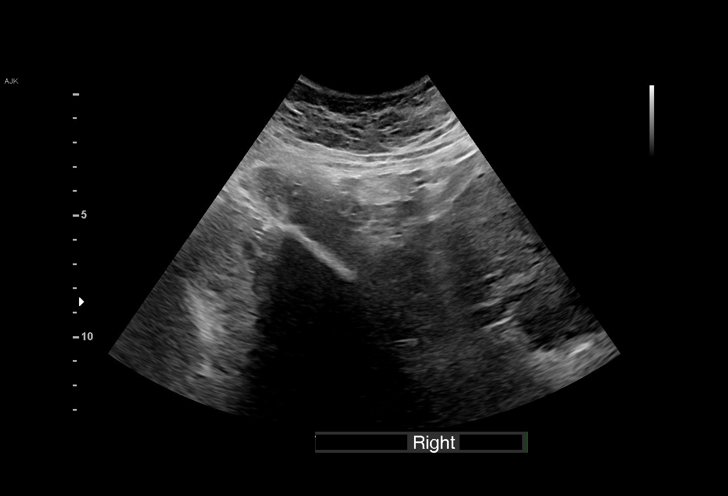
[im 18/59]
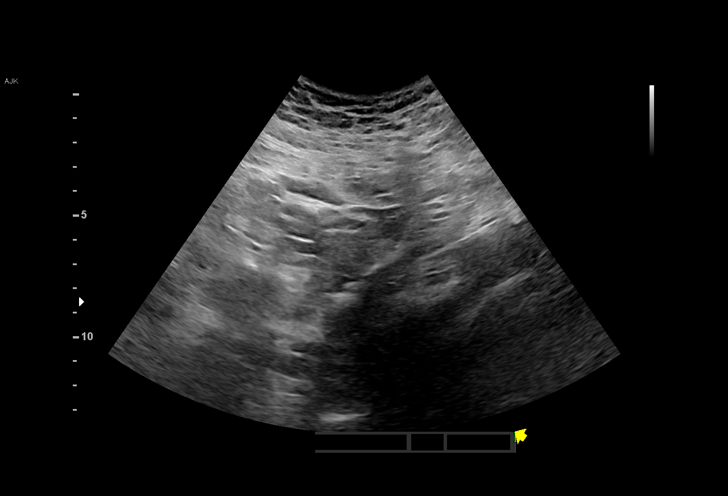
[im 22/59]
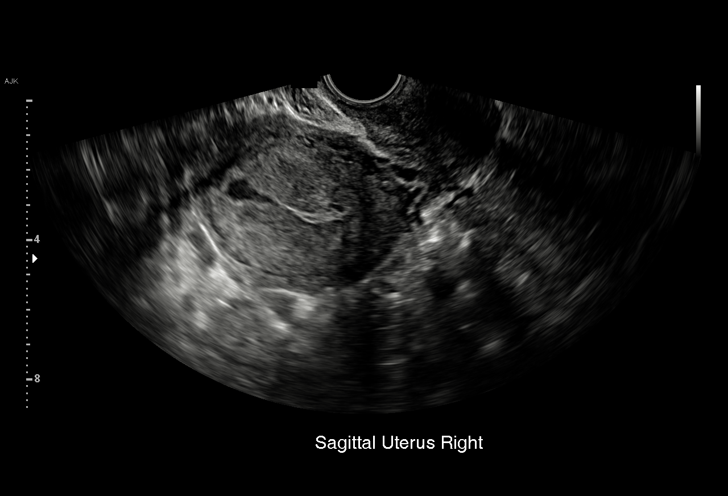
[im 26/59]
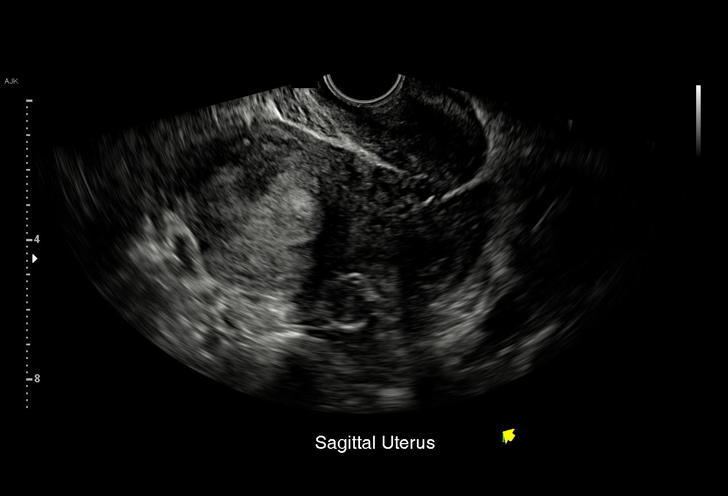
[im 31/59]
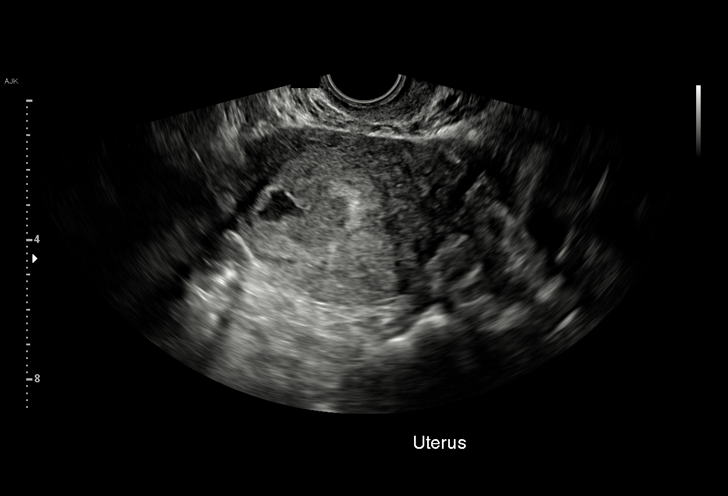
[im 33/59]
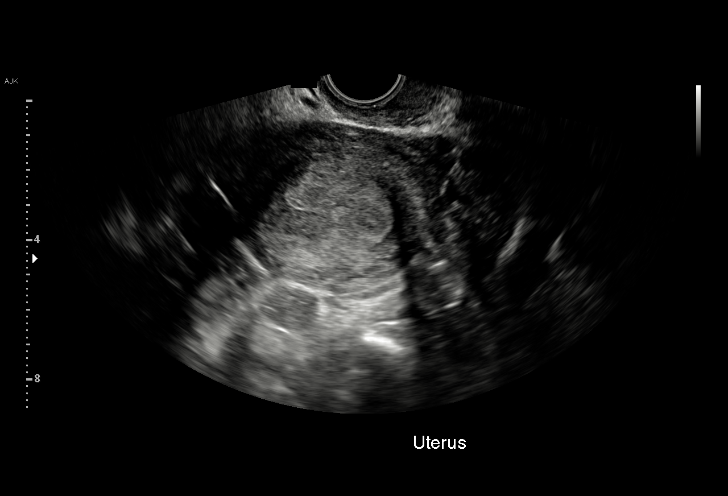
[im 37/59]
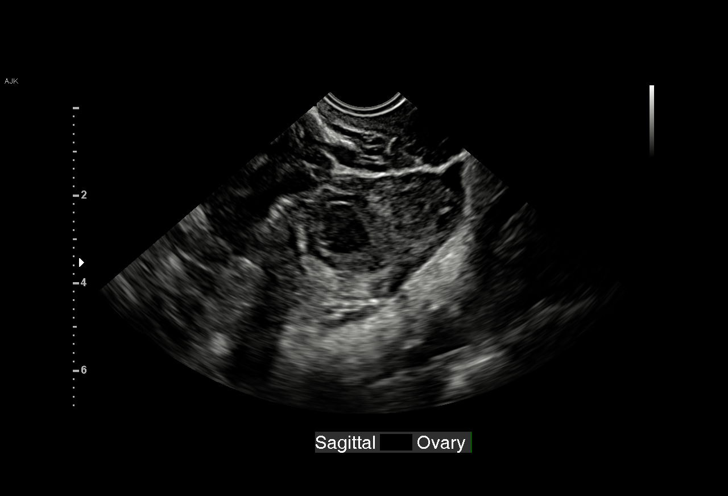
[im 41/59]
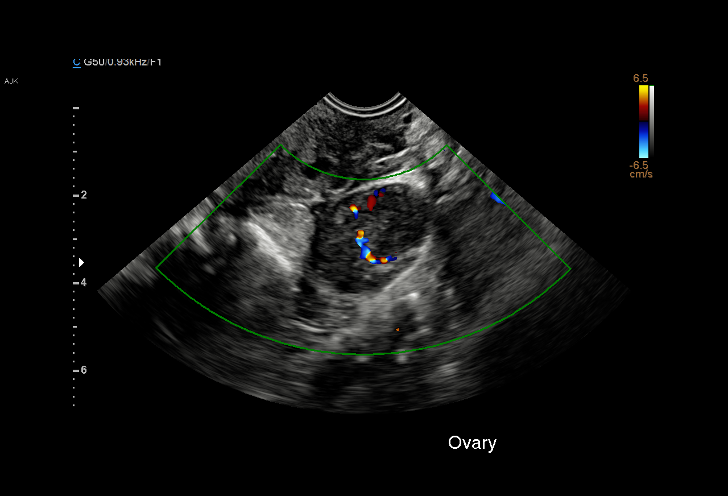
[im 46/59]
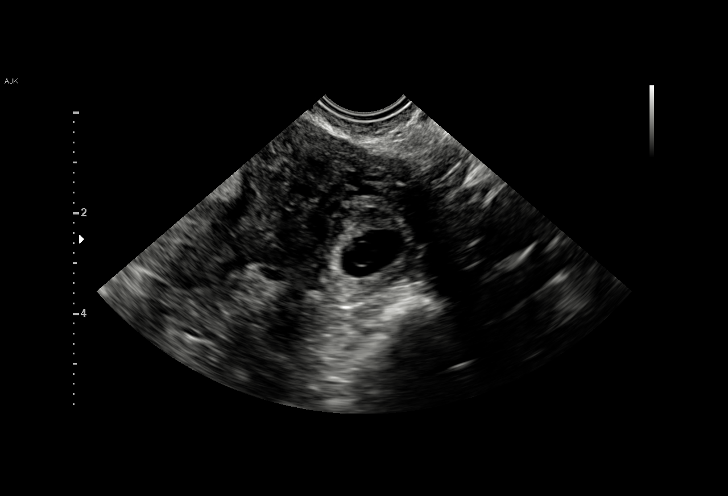
[im 50/59]
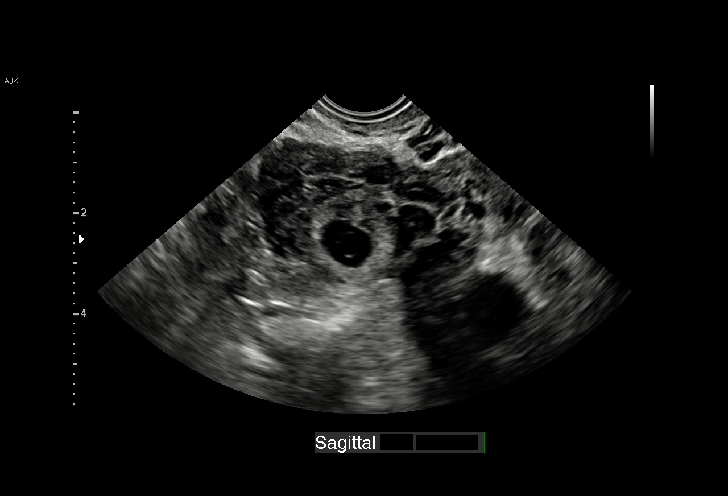
[im 54/59]
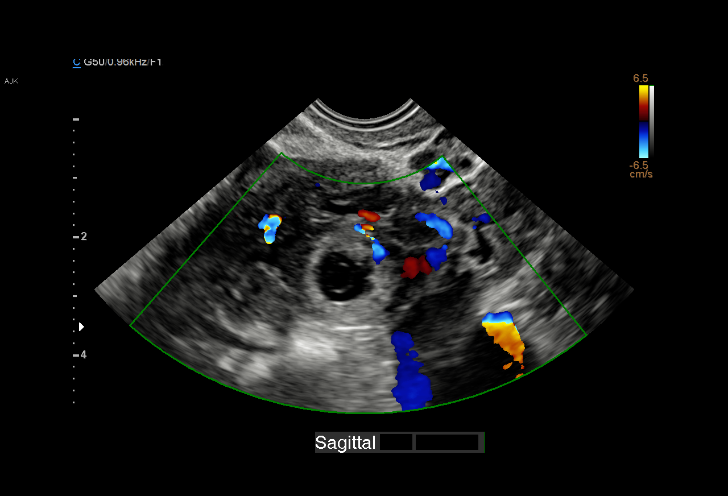
[im 59/59]
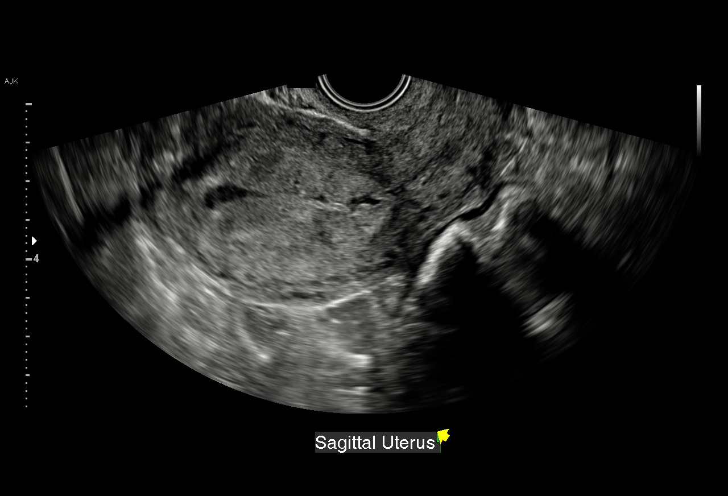

[15 of 28 positions shown; findings below may reference images not displayed]

FINDINGS: Intrauterine gestational sac: None ; small amount of fluid noted in
the endometrial cavity

Maternal uterus/adnexae: An ectopic gestational sac containing a
yolk sac is seen within the left adnexa separate from the left
ovary. This measures 2.7 x 2.1 x 2.2 cm. No evidence of free fluid.

Left ovary contains a corpus luteum. Right ovary not visualized
consistent with history of prior right oophorectomy. No right
adnexal mass identified.
IMPRESSION: Ectopic pregnancy in left adnexa, measuring 2.7 x 2.1 x 2.2 cm. No
evidence of hemoperitoneum.

Critical Value/emergent results were called by telephone at the time
NOCERA, ELIUDA , who verbally acknowledged these results.

## 2018-12-26 ENCOUNTER — Other Ambulatory Visit: Payer: Self-pay

## 2018-12-26 DIAGNOSIS — Z20822 Contact with and (suspected) exposure to covid-19: Secondary | ICD-10-CM

## 2018-12-27 ENCOUNTER — Telehealth: Payer: 59 | Admitting: Emergency Medicine

## 2018-12-27 DIAGNOSIS — Z20822 Contact with and (suspected) exposure to covid-19: Secondary | ICD-10-CM

## 2018-12-27 DIAGNOSIS — Z20828 Contact with and (suspected) exposure to other viral communicable diseases: Secondary | ICD-10-CM

## 2018-12-27 NOTE — Progress Notes (Signed)
E-Visit for Corona Virus Screening   Your current symptoms could be consistent with the coronavirus.  Many health care providers can now test patients at their office but not all are.  [facility] has multiple testing sites. For information on our COVID testing locations and hours go to [email or web address]  Please quarantine yourself while awaiting your test results.  We are enrolling you in our [place] for [place] . Daily you will receive a questionnaire within the [facility] website. Our COVID 19 response team willl be monitoriing your responses daily. Please continue good preventive care measures, including:  frequent hand-washing, avoid touching your face, cover coughs/sneezes, stay out of crowds and keep a 6 foot distance from others.    COVID-19 is a respiratory illness with symptoms that are similar to the flu. Symptoms are typically mild to moderate, but there have been cases of severe illness and death due to the virus. The following symptoms may appear 2-14 days after exposure: . Fever . Cough . Shortness of breath or difficulty breathing . Chills . Repeated shaking with chills . Muscle pain . Headache . Sore throat . New loss of taste or smell . Fatigue . Congestion or runny nose . Nausea or vomiting . Diarrhea You can go to one of the testing sites listed below, while they are opened (see hours). You do not need an order and will stay in your car during the test. You do need to self isolate until your results return and if positive 10 days from when your symptoms started and until you are 3 days fever free.    Testing Locations (Monday - Friday, 8 a.m. - 3:30 p.m.)  [place]: [facility] at [facility], [address] [place], [place]: [place], [place], [place], [place] (entrance [address])   [place]: (Closed each Monday): Testing site relocated to the  short stay covered drive at [facility]. (Use the [employer] entrance to [facility] next to [facility].)  If you develop fever/cough/breathlessness, please stay home for 10 days with improving symptoms and until you have had 24 hours of no fever (without taking a fever reducer).  Go to the nearest hospital ED for assessment if fever/cough/breathlessness are severe or illness seems like a threat to life.  It is vitally important that if you feel that you have an infection such as this virus or any other virus that you stay home and away from places where you may spread it to others.  You should avoid contact with people age 73 and older.   You should wear a mask or cloth face covering over your nose and mouth if you must be around other people or animals, including pets (even at home). Try to stay at least 6 feet away from other people. This will protect the people around you.  You can use medication such as over the counter cough medicine like DayQuil/NyQuil  You may also take Ibuprofen (motrin) as needed for fever.   Reduce your risk of any infection by using the same precautions used for avoiding the common cold or flu:  Marland Kitchen Wash your hands often with soap and warm water for at least 20 seconds.  If soap and water are not readily available, use an alcohol-based hand sanitizer with at least 60% alcohol.  . If coughing or sneezing, cover your mouth and nose by coughing or sneezing into the elbow areas of  your shirt or coat, into a tissue or into your sleeve (not your hands). . Avoid shaking hands with others and consider head nods or verbal greetings only. . Avoid touching your eyes, nose, or mouth with unwashed hands.  . Avoid close contact with people who are sick. . Avoid places or events with large numbers of people in one location, like concerts or sporting events. . Carefully consider travel plans you have or are making. . If you are planning any travel outside or  inside the [place], visit the CDC's Travelers' Health webpage for the latest health notices. . If you have some symptoms but not all symptoms, continue to monitor at home and seek medical attention if your symptoms worsen. . If you are having a medical emergency, call 911.  HOME CARE . Only take medications as instructed by your medical team. . Drink plenty of fluids and get plenty of rest. . A steam or ultrasonic humidifier can help if you have congestion.   GET HELP RIGHT AWAY IF YOU HAVE EMERGENCY WARNING SIGNS** FOR COVID-19. If you or someone is showing any of these signs seek emergency medical care immediately. Call 911 or proceed to your closest emergency facility if: . You develop worsening high fever. . Trouble breathing . Bluish lips or face . Persistent pain or pressure in the chest . New confusion . Inability to wake or stay awake . You cough up blood. . Your symptoms become more severe  **This list is not all possible symptoms. Contact your medical provider for any symptoms that are sever or concerning to you.   MAKE SURE YOU   Understand these instructions.  Will watch your condition.  Will get help right away if you are not doing well or get worse.  Your e-visit answers were reviewed by a board certified advanced clinical practitioner to complete your personal care plan.  Depending on the condition, your plan could have included both over the counter or prescription medications.  If there is a problem please reply once you have received a response from your provider.  Your safety is important to [place].  If you have drug allergies check your prescription carefully.    You can use MyChart to ask questions about today's visit, request a non-urgent call back, or ask for a work or school excuse for 24 hours related to this e-Visit. If it has been greater than 24 hours you will need to follow up with your provider, or enter a new e-Visit to address those concerns. You will get an  e-mail in the next two days asking about your experience.  I hope that your e-visit has been valuable and will speed your recovery. Thank you for using e-visits.   Greater than 5 but less than 10 minutes spent researching, coordinating, and implementing care for this patient today

## 2018-12-28 LAB — NOVEL CORONAVIRUS, NAA: SARS-CoV-2, NAA: NOT DETECTED

## 2019-06-14 LAB — HM PAP SMEAR
HM Pap smear: NEGATIVE
HPV, high-risk: POSITIVE

## 2020-04-28 ENCOUNTER — Other Ambulatory Visit: Payer: Self-pay

## 2020-04-28 DIAGNOSIS — R112 Nausea with vomiting, unspecified: Secondary | ICD-10-CM | POA: Insufficient documentation

## 2020-04-28 DIAGNOSIS — R2243 Localized swelling, mass and lump, lower limb, bilateral: Secondary | ICD-10-CM | POA: Insufficient documentation

## 2020-04-28 DIAGNOSIS — Z9889 Other specified postprocedural states: Secondary | ICD-10-CM | POA: Insufficient documentation

## 2020-04-28 DIAGNOSIS — Z789 Other specified health status: Secondary | ICD-10-CM | POA: Insufficient documentation

## 2020-04-30 ENCOUNTER — Ambulatory Visit (INDEPENDENT_AMBULATORY_CARE_PROVIDER_SITE_OTHER): Payer: Self-pay

## 2020-04-30 ENCOUNTER — Other Ambulatory Visit: Payer: Self-pay

## 2020-04-30 ENCOUNTER — Ambulatory Visit (INDEPENDENT_AMBULATORY_CARE_PROVIDER_SITE_OTHER): Payer: 59 | Admitting: Cardiology

## 2020-04-30 ENCOUNTER — Encounter: Payer: Self-pay | Admitting: Cardiology

## 2020-04-30 VITALS — BP 112/84 | HR 87 | Ht 65.0 in | Wt 217.1 lb

## 2020-04-30 DIAGNOSIS — R42 Dizziness and giddiness: Secondary | ICD-10-CM

## 2020-04-30 DIAGNOSIS — R002 Palpitations: Secondary | ICD-10-CM

## 2020-04-30 DIAGNOSIS — R011 Cardiac murmur, unspecified: Secondary | ICD-10-CM

## 2020-04-30 DIAGNOSIS — E559 Vitamin D deficiency, unspecified: Secondary | ICD-10-CM | POA: Insufficient documentation

## 2020-04-30 DIAGNOSIS — E669 Obesity, unspecified: Secondary | ICD-10-CM

## 2020-04-30 NOTE — Patient Instructions (Addendum)
Medication Instructions:  Your physician recommends that you continue on your current medications as directed. Please refer to the Current Medication list given to you today.  *If you need a refill on your cardiac medications before your next appointment, please call your pharmacy*   Lab Work: None If you have labs (blood work) drawn today and your tests are completely normal, you will receive your results only by: Marland Kitchen MyChart Message (if you have MyChart) OR . A paper copy in the mail If you have any lab test that is abnormal or we need to change your treatment, we will call you to review the results.   Testing/Procedures: Your physician has requested that you have an echocardiogram. Echocardiography is a painless test that uses sound waves to create images of your heart. It provides your doctor with information about the size and shape of your heart and how well your heart's chambers and valves are working. This procedure takes approximately one hour. There are no restrictions for this procedure.  A zio monitor was ordered today. It will remain on for 7 days. You will then return monitor and event diary in provided box. It takes 1-2 weeks for report to be downloaded and returned to [place]. We will call you with the results. If monitor falls off or has orange flashing light, please call Zio for further instructions.    Follow-Up: At [facility], you and your health needs are our priority.  As part of our continuing mission to provide you with exceptional heart care, we have created designated Provider Care Teams.  These Care Teams include your primary Cardiologist (physician) and Advanced Practice Providers (APPs -  Physician Assistants and Nurse Practitioners) who all work together to provide you with the care you need, when you need it.  We recommend signing up for the patient portal called "MyChart".  Sign up information is provided on this After Visit Summary.  MyChart is used to connect  with patients for Virtual Visits (Telemedicine).  Patients are able to view lab/test results, encounter notes, upcoming appointments, etc.  Non-urgent messages can be sent to your provider as well.   To learn more about what you can do with MyChart, go to [email or web address].    Your next appointment:   3 month(s)  The format for your next appointment:   In Person  Provider:   Berniece Salines, DO   Other Instructions  Echocardiogram An echocardiogram is a test that uses sound waves (ultrasound) to produce images of the heart. Images from an echocardiogram can provide important information about:  Heart size and shape.  The size and thickness and movement of your heart's walls.  Heart muscle function and strength.  Heart valve function or if you have stenosis. Stenosis is when the heart valves are too narrow.  If blood is flowing backward through the heart valves (regurgitation).  A tumor or infectious growth around the heart valves.  Areas of heart muscle that are not working well because of poor blood flow or injury from a heart attack.  Aneurysm detection. An aneurysm is a weak or damaged part of an artery wall. The wall bulges out from the normal force of blood pumping through the body. Tell a health care provider about:  Any allergies you have.  All medicines you are taking, including vitamins, herbs, eye drops, creams, and over-the-counter medicines.  Any blood disorders you have.  Any surgeries you have had.  Any medical conditions you have.  Whether you are pregnant or  may be pregnant. What are the risks? Generally, this is a safe test. However, problems may occur, including an allergic reaction to dye (contrast) that may be used during the test. What happens before the test? No specific preparation is needed. You may eat and drink normally. What happens during the test?  You will take off your clothes from the waist up and put on a hospital  gown.  Electrodes or electrocardiogram (ECG)patches may be placed on your chest. The electrodes or patches are then connected to a device that monitors your heart rate and rhythm.  You will lie down on a table for an ultrasound exam. A gel will be applied to your chest to help sound waves pass through your skin.  A handheld device, called a transducer, will be pressed against your chest and moved over your heart. The transducer produces sound waves that travel to your heart and bounce back (or "echo" back) to the transducer. These sound waves will be captured in real-time and changed into images of your heart that can be viewed on a video monitor. The images will be recorded on a computer and reviewed by your health care provider.  You may be asked to change positions or hold your breath for a short time. This makes it easier to get different views or better views of your heart.  In some cases, you may receive contrast through an IV in one of your veins. This can improve the quality of the pictures from your heart. The procedure may vary among health care providers and hospitals.   What can I expect after the test? You may return to your normal, everyday life, including diet, activities, and medicines, unless your health care provider tells you not to do that. Follow these instructions at home:  It is up to you to get the results of your test. Ask your health care provider, or the department that is doing the test, when your results will be ready.  Keep all follow-up visits. This is important. Summary  An echocardiogram is a test that uses sound waves (ultrasound) to produce images of the heart.  Images from an echocardiogram can provide important information about the size and shape of your heart, heart muscle function, heart valve function, and other possible heart problems.  You do not need to do anything to prepare before this test. You may eat and drink normally.  After the  echocardiogram is completed, you may return to your normal, everyday life, unless your health care provider tells you not to do that. This information is not intended to replace advice given to you by your health care provider. Make sure you discuss any questions you have with your health care provider. Document Revised: 09/17/2019 Document Reviewed: 09/17/2019 Elsevier Patient Education  2021 [employer].

## 2020-04-30 NOTE — Progress Notes (Signed)
Cardiology Office Note:    Date:  04/30/2020   ID:  Erika Howe, DOB [birth year], MRN [id number]  PCP:  Erika Leriche, PA-C  Cardiologist:  Erika Salines, DO  Electrophysiologist:  None   Referring MD: Erika Howe, Dorothy, PA-C   I have had some dizziness/fogginess  History of Present Illness:    Erika Howe is a 35 y.o. female with a hx of obesity, here today to be evaluated for lightheadedness and palpitations with blurry vision.  Patient says that she started Herbalife couple months back and after taking the supplement she felt significant lightheadedness palpitations.  She notes that she stopped it for a while and did not last month she started back on again she was at the counter more when she fell seen palpitations and blurriness.  Since that time she has stopped the herbal life.  But what she is feeling is the fact that she wakes up every morning feeling very dizzy and foggy.  She feel like her balance is off and sometimes when she is lying in the bed she feel the sensation as well.  She has not had any syncope episode.  No shortness of breath or chest pain.  Her biggest concern is the dizzy/fogginess in her potential for weight loss.  Past Medical History:  Diagnosis Date  . Localized swelling of both lower legs   . Medical history non-contributory   . PONV (postoperative nausea and vomiting)   . PONV (postoperative nausea and vomiting)     Past Surgical History:  Procedure Laterality Date  . INCISIONAL BREAST BIOPSY Right   . LAPAROTOMY Right 07/09/2015   Procedure: LAPAROTOMY;  Surgeon: Linda Hedges, DO;  Location: [facility];  Service: Gynecology;  Laterality: Right;  right ovarian cyst removal.   . OOPHORECTOMY Right 07/09/2015   Procedure: OOPHORECTOMY;  Surgeon: Linda Hedges, DO;  Location: [facility];  Service: Gynecology;  Laterality: Right;  possible    Current Medications: Current Meds  Medication Sig  . acetaminophen (TYLENOL) 500 MG tablet Take 500-1,000 mg by  mouth every 6 (six) hours as needed for mild pain.  . Cholecalciferol (VITAMIN D) 50 MCG (2000 UT) tablet Take 1 tablet by mouth daily.  Marland Kitchen ibuprofen (ADVIL) 200 MG tablet Take 200 mg by mouth daily as needed for pain.  . Multiple Vitamin (MULTI-VITAMIN) tablet Take 1 tablet by mouth daily.     Allergies:   Doxycycline   Social History   Socioeconomic History  . Marital status: Divorced    Spouse name: Not on file  . Number of children: Not on file  . Years of education: Not on file  . Highest education level: Not on file  Occupational History  . Not on file  Tobacco Use  . Smoking status: Never Smoker  . Smokeless tobacco: Never Used  Substance and Sexual Activity  . Alcohol use: Yes    Comment: occasional  . Drug use: No  . Sexual activity: Yes    Partners: Male    Birth control/protection: None  Other Topics Concern  . Not on file  Social History Narrative  . Not on file   Social Determinants of Health   Financial Resource Strain: Not on file  Food Insecurity: Not on file  Transportation Needs: Not on file  Physical Activity: Not on file  Stress: Not on file  Social Connections: Not on file     Family History: The patient's family history includes Mental illness in her mother.  ROS:   Review of Systems  Constitution: Negative for decreased appetite, fever and weight gain.  HENT: Negative for congestion, ear discharge, hoarse voice and sore throat.   Eyes: Negative for discharge, redness, vision loss in right eye and visual halos.  Cardiovascular: Negative for chest pain, dyspnea on exertion, leg swelling, orthopnea and palpitations.  Respiratory: Negative for cough, hemoptysis, shortness of breath and snoring.   Endocrine: Negative for heat intolerance and polyphagia.  Hematologic/Lymphatic: Negative for bleeding problem. Does not bruise/bleed easily.  Skin: Negative for flushing, nail changes, rash and suspicious lesions.  Musculoskeletal: Negative for  arthritis, joint pain, muscle cramps, myalgias, neck pain and stiffness.  Gastrointestinal: Negative for abdominal pain, bowel incontinence, diarrhea and excessive appetite.  Genitourinary: Negative for decreased libido, genital sores and incomplete emptying.  Neurological: Negative for brief paralysis, focal weakness, headaches and loss of balance.  Psychiatric/Behavioral: Negative for altered mental status, depression and suicidal ideas.  Allergic/Immunologic: Negative for HIV exposure and persistent infections.    EKGs/Labs/Other Studies Reviewed:    The following studies were reviewed today:   EKG:  The ekg ordered today demonstrates sinus rhythm, heart rate 87 bpm with poor R wave progression in the precordial leads.  Recent Labs: No results found for requested labs within last 8760 hours.  Recent Lipid Panel No results found for: CHOL, TRIG, HDL, CHOLHDL, VLDL, LDLCALC, LDLDIRECT  Physical Exam:    VS:  BP 112/84 (BP Location: Right Arm)   Pulse 87   Ht 5\' 5"  (1.651 m)   Wt 217 lb 1.3 oz (98.5 kg)   SpO2 98%   BMI 36.12 kg/m     Wt Readings from Last 3 Encounters:  04/30/20 217 lb 1.3 oz (98.5 kg)  06/16/16 260 lb 1.9 oz (118 kg)  06/10/16 263 lb (119.3 kg)     GEN: Well nourished, well developed in no acute distress HEENT: Normal NECK: No JVD; No carotid bruits LYMPHATICS: No lymphadenopathy CARDIAC: S1S2 noted,RRR, 1/6 diastolic murmurs, rubs, gallops RESPIRATORY:  Clear to auscultation without rales, wheezing or rhonchi  ABDOMEN: Soft, non-tender, non-distended, +bowel sounds, no guarding. EXTREMITIES: No edema, No cyanosis, no clubbing MUSCULOSKELETAL:  No deformity  SKIN: Warm and dry NEUROLOGIC:  Alert and oriented x 3, non-focal PSYCHIATRIC:  Normal affect, good insight  ASSESSMENT:    1. Dizziness   2. Murmur   3. Obesity, unspecified classification, unspecified obesity type, unspecified whether serious comorbidity present   4. Palpitations     PLAN:     I would like to rule out a cardiovascular etiology of this palpitation/dizziness, therefore at this time I would like to placed a zio patch for 7 days.   In additon for her diastolic murmur a transthoracic echocardiogram will be ordered to assess LV/RV function and any structural abnormalities.  Her dizziness/fogginess description with balance issues and especially feeling the sensation In bed I like for the patient to be worked up by ENT for Mnire's disease.  We will refer her today.  The patient understands the need to lose weight with diet and exercise. We have discussed specific strategies for this.  She is also interested in her medical weight management program and will refer her to that program.  The patient is in agreement with the above plan. The patient left the office in stable condition.  The patient will follow up in 3 months or sooner if needed.   Medication Adjustments/Labs and Tests Ordered: Current medicines are reviewed at length with the patient today.  Concerns regarding medicines are outlined above.  Orders Placed This Encounter  Procedures  . Amb Ref to Medical Weight Management  . Ambulatory referral to ENT  . LONG TERM MONITOR (3-14 DAYS)  . EKG 12-Lead  . ECHOCARDIOGRAM COMPLETE   No orders of the defined types were placed in this encounter.   Patient Instructions   Medication Instructions:  Your physician recommends that you continue on your current medications as directed. Please refer to the Current Medication list given to you today.  *If you need a refill on your cardiac medications before your next appointment, please call your pharmacy*   Lab Work: None If you have labs (blood work) drawn today and your tests are completely normal, you will receive your results only by: Marland Kitchen MyChart Message (if you have MyChart) OR . A paper copy in the mail If you have any lab test that is abnormal or we need to change your treatment, we will call  you to review the results.   Testing/Procedures: Your physician has requested that you have an echocardiogram. Echocardiography is a painless test that uses sound waves to create images of your heart. It provides your doctor with information about the size and shape of your heart and how well your heart's chambers and valves are working. This procedure takes approximately one hour. There are no restrictions for this procedure.  A zio monitor was ordered today. It will remain on for 7 days. You will then return monitor and event diary in provided box. It takes 1-2 weeks for report to be downloaded and returned to [place]. We will call you with the results. If monitor falls off or has orange flashing light, please call Zio for further instructions.    Follow-Up: At [facility], you and your health needs are our priority.  As part of our continuing mission to provide you with exceptional heart care, we have created designated Provider Care Teams.  These Care Teams include your primary Cardiologist (physician) and Advanced Practice Providers (APPs -  Physician Assistants and Nurse Practitioners) who all work together to provide you with the care you need, when you need it.  We recommend signing up for the patient portal called "MyChart".  Sign up information is provided on this After Visit Summary.  MyChart is used to connect with patients for Virtual Visits (Telemedicine).  Patients are able to view lab/test results, encounter notes, upcoming appointments, etc.  Non-urgent messages can be sent to your provider as well.   To learn more about what you can do with MyChart, go to [email or web address].    Your next appointment:   3 month(s)  The format for your next appointment:   In Person  Provider:   Berniece Salines, DO   Other Instructions  Echocardiogram An echocardiogram is a test that uses sound waves (ultrasound) to produce images of the heart. Images from an echocardiogram can provide  important information about:  Heart size and shape.  The size and thickness and movement of your heart's walls.  Heart muscle function and strength.  Heart valve function or if you have stenosis. Stenosis is when the heart valves are too narrow.  If blood is flowing backward through the heart valves (regurgitation).  A tumor or infectious growth around the heart valves.  Areas of heart muscle that are not working well because of poor blood flow or injury from a heart attack.  Aneurysm detection. An aneurysm is a weak or damaged part of an artery wall. The wall bulges out from the normal force of  blood pumping through the body. Tell a health care provider about:  Any allergies you have.  All medicines you are taking, including vitamins, herbs, eye drops, creams, and over-the-counter medicines.  Any blood disorders you have.  Any surgeries you have had.  Any medical conditions you have.  Whether you are pregnant or may be pregnant. What are the risks? Generally, this is a safe test. However, problems may occur, including an allergic reaction to dye (contrast) that may be used during the test. What happens before the test? No specific preparation is needed. You may eat and drink normally. What happens during the test?  You will take off your clothes from the waist up and put on a hospital gown.  Electrodes or electrocardiogram (ECG)patches may be placed on your chest. The electrodes or patches are then connected to a device that monitors your heart rate and rhythm.  You will lie down on a table for an ultrasound exam. A gel will be applied to your chest to help sound waves pass through your skin.  A handheld device, called a transducer, will be pressed against your chest and moved over your heart. The transducer produces sound waves that travel to your heart and bounce back (or "echo" back) to the transducer. These sound waves will be captured in real-time and changed into  images of your heart that can be viewed on a video monitor. The images will be recorded on a computer and reviewed by your health care provider.  You may be asked to change positions or hold your breath for a short time. This makes it easier to get different views or better views of your heart.  In some cases, you may receive contrast through an IV in one of your veins. This can improve the quality of the pictures from your heart. The procedure may vary among health care providers and hospitals.   What can I expect after the test? You may return to your normal, everyday life, including diet, activities, and medicines, unless your health care provider tells you not to do that. Follow these instructions at home:  It is up to you to get the results of your test. Ask your health care provider, or the department that is doing the test, when your results will be ready.  Keep all follow-up visits. This is important. Summary  An echocardiogram is a test that uses sound waves (ultrasound) to produce images of the heart.  Images from an echocardiogram can provide important information about the size and shape of your heart, heart muscle function, heart valve function, and other possible heart problems.  You do not need to do anything to prepare before this test. You may eat and drink normally.  After the echocardiogram is completed, you may return to your normal, everyday life, unless your health care provider tells you not to do that. This information is not intended to replace advice given to you by your health care provider. Make sure you discuss any questions you have with your health care provider. Document Revised: 09/17/2019 Document Reviewed: 09/17/2019 Elsevier Patient Education  2021 [place].      Adopting a Healthy Lifestyle.  Know what a healthy weight is for you (roughly BMI <25) and aim to maintain this   Aim for 7+ servings of fruits and vegetables daily   65-80+ fluid  ounces of water or unsweet tea for healthy kidneys   Limit to max 1 drink of alcohol per day; avoid smoking/tobacco   Limit  animal fats in diet for cholesterol and heart health - choose grass fed whenever available   Avoid highly processed foods, and foods high in saturated/trans fats   Aim for low stress - take time to unwind and care for your mental health   Aim for 150 min of moderate intensity exercise weekly for heart health, and weights twice weekly for bone health   Aim for 7-9 hours of sleep daily   When it comes to diets, agreement about the perfect plan isnt easy to find, even among the experts. Experts at the [place] developed an idea known as the Healthy Eating Plate. Just imagine a plate divided into logical, healthy portions.   The emphasis is on diet quality:   Load up on vegetables and fruits - one-half of your plate: Aim for color and variety, and remember that potatoes dont count.   Go for whole grains - one-quarter of your plate: Whole wheat, barley, wheat berries, quinoa, oats, brown rice, and foods made with them. If you want pasta, go with whole wheat pasta.   Protein power - one-quarter of your plate: Fish, chicken, beans, and nuts are all healthy, versatile protein sources. Limit red meat.   The diet, however, does go beyond the plate, offering a few other suggestions.   Use healthy plant oils, such as olive, canola, soy, corn, sunflower and peanut. Check the labels, and avoid partially hydrogenated oil, which have unhealthy trans fats.   If youre thirsty, drink water. Coffee and tea are good in moderation, but skip sugary drinks and limit milk and dairy products to one or two daily servings.   The type of carbohydrate in the diet is more important than the amount. Some sources of carbohydrates, such as vegetables, fruits, whole grains, and beans-are healthier than others.   Finally, stay active  Signed, Erika Salines, DO   04/30/2020 10:26 AM    [place]

## 2020-05-28 ENCOUNTER — Ambulatory Visit (INDEPENDENT_AMBULATORY_CARE_PROVIDER_SITE_OTHER): Payer: 59 | Admitting: Bariatrics

## 2020-06-05 ENCOUNTER — Ambulatory Visit (HOSPITAL_BASED_OUTPATIENT_CLINIC_OR_DEPARTMENT_OTHER)
Admission: RE | Admit: 2020-06-05 | Discharge: 2020-06-05 | Disposition: A | Discharge status: Home / Self Care | Payer: No Typology Code available for payment source | Source: Ambulatory Visit | Attending: Cardiology | Admitting: Cardiology

## 2020-06-05 ENCOUNTER — Other Ambulatory Visit: Payer: Self-pay

## 2020-06-05 DIAGNOSIS — R011 Cardiac murmur, unspecified: Secondary | ICD-10-CM | POA: Diagnosis not present

## 2020-06-05 LAB — ECHOCARDIOGRAM COMPLETE
AR max vel: 2.56 cm2
AV Area VTI: 2.36 cm2
AV Area mean vel: 2.39 cm2
AV Mean grad: 6 mmHg
AV Peak grad: 10.9 mmHg
Ao pk vel: 1.65 m/s
Area-P 1/2: 3.16 cm2
S' Lateral: 2.98 cm

## 2020-06-05 NOTE — Progress Notes (Signed)
  Echocardiogram 2D Echocardiogram has been performed.  Erika Howe F 06/05/2020, 11:55 AM

## 2020-06-09 ENCOUNTER — Ambulatory Visit (INDEPENDENT_AMBULATORY_CARE_PROVIDER_SITE_OTHER): Payer: 59 | Admitting: Bariatrics

## 2020-06-11 ENCOUNTER — Ambulatory Visit (INDEPENDENT_AMBULATORY_CARE_PROVIDER_SITE_OTHER): Payer: 59 | Admitting: Bariatrics

## 2020-06-23 ENCOUNTER — Ambulatory Visit (INDEPENDENT_AMBULATORY_CARE_PROVIDER_SITE_OTHER): Payer: 59 | Admitting: Bariatrics

## 2020-06-23 ENCOUNTER — Ambulatory Visit (INDEPENDENT_AMBULATORY_CARE_PROVIDER_SITE_OTHER): Payer: No Typology Code available for payment source | Admitting: Family Medicine

## 2020-07-03 ENCOUNTER — Telehealth: Payer: Self-pay

## 2020-07-03 NOTE — Telephone Encounter (Signed)
Spoke with patient about her monitor results. Patient verbalizes understanding. No further questions or concerns at this time.   

## 2020-07-07 ENCOUNTER — Ambulatory Visit (INDEPENDENT_AMBULATORY_CARE_PROVIDER_SITE_OTHER): Payer: 59 | Admitting: Family Medicine

## 2020-07-08 ENCOUNTER — Ambulatory Visit (INDEPENDENT_AMBULATORY_CARE_PROVIDER_SITE_OTHER): Payer: No Typology Code available for payment source | Admitting: Otolaryngology

## 2020-07-14 ENCOUNTER — Telehealth: Payer: Self-pay

## 2020-07-14 NOTE — Telephone Encounter (Signed)
Spoke with patient regarding results and recommendation.  Patient verbalizes understanding and is agreeable to plan of care. Advised patient to call back with any issues or concerns.  

## 2020-07-14 NOTE — Telephone Encounter (Signed)
-----   Message from Berniece Salines, DO sent at 06/08/2020  8:52 AM EDT ----- Normal echo please notify patient. FU as planned.  Please forward echo result to the pcp and release to my chart.

## 2020-08-05 ENCOUNTER — Ambulatory Visit: Payer: Self-pay | Admitting: Cardiology

## 2020-08-13 ENCOUNTER — Ambulatory Visit: Payer: Self-pay | Admitting: Cardiology

## 2020-11-19 LAB — LAB REPORT - SCANNED
EGFR: 115
TSH: 0.93 (ref 0.41–5.90)

## 2021-05-25 LAB — CHLAMYDIA/GONOCOCCUS/TRICHOMONAS, NAA: Chlamydia, Swab/Urine, PCR: NEGATIVE

## 2021-09-15 ENCOUNTER — Encounter (INDEPENDENT_AMBULATORY_CARE_PROVIDER_SITE_OTHER): Payer: Self-pay

## 2022-02-08 DIAGNOSIS — E669 Obesity, unspecified: Secondary | ICD-10-CM | POA: Diagnosis not present

## 2022-02-08 DIAGNOSIS — Z6838 Body mass index (BMI) 38.0-38.9, adult: Secondary | ICD-10-CM | POA: Diagnosis not present

## 2022-02-08 DIAGNOSIS — R7303 Prediabetes: Secondary | ICD-10-CM | POA: Diagnosis not present

## 2022-02-24 DIAGNOSIS — R7303 Prediabetes: Secondary | ICD-10-CM | POA: Diagnosis not present

## 2022-02-24 DIAGNOSIS — F54 Psychological and behavioral factors associated with disorders or diseases classified elsewhere: Secondary | ICD-10-CM | POA: Diagnosis not present

## 2022-02-24 DIAGNOSIS — Z6838 Body mass index (BMI) 38.0-38.9, adult: Secondary | ICD-10-CM | POA: Diagnosis not present

## 2022-02-24 DIAGNOSIS — E669 Obesity, unspecified: Secondary | ICD-10-CM | POA: Diagnosis not present

## 2022-03-10 DIAGNOSIS — Z713 Dietary counseling and surveillance: Secondary | ICD-10-CM | POA: Diagnosis not present

## 2022-03-10 DIAGNOSIS — E669 Obesity, unspecified: Secondary | ICD-10-CM | POA: Diagnosis not present

## 2022-05-22 DIAGNOSIS — S76312A Strain of muscle, fascia and tendon of the posterior muscle group at thigh level, left thigh, initial encounter: Secondary | ICD-10-CM | POA: Diagnosis not present

## 2022-05-22 DIAGNOSIS — M62838 Other muscle spasm: Secondary | ICD-10-CM | POA: Diagnosis not present

## 2022-05-23 DIAGNOSIS — Z713 Dietary counseling and surveillance: Secondary | ICD-10-CM | POA: Diagnosis not present

## 2022-05-23 DIAGNOSIS — E669 Obesity, unspecified: Secondary | ICD-10-CM | POA: Diagnosis not present

## 2022-06-03 DIAGNOSIS — S336XXA Sprain of sacroiliac joint, initial encounter: Secondary | ICD-10-CM | POA: Diagnosis not present

## 2022-06-10 DIAGNOSIS — M5432 Sciatica, left side: Secondary | ICD-10-CM | POA: Diagnosis not present

## 2022-06-14 ENCOUNTER — Ambulatory Visit
Admission: RE | Admit: 2022-06-14 | Discharge: 2022-06-14 | Disposition: A | Discharge status: Home / Self Care | Payer: Commercial Managed Care - PPO | Source: Ambulatory Visit | Attending: Family Medicine | Admitting: Family Medicine

## 2022-06-14 ENCOUNTER — Other Ambulatory Visit: Payer: Self-pay | Admitting: Family Medicine

## 2022-06-14 DIAGNOSIS — M5432 Sciatica, left side: Secondary | ICD-10-CM | POA: Diagnosis not present

## 2022-06-14 DIAGNOSIS — R200 Anesthesia of skin: Secondary | ICD-10-CM | POA: Diagnosis not present

## 2022-06-16 ENCOUNTER — Other Ambulatory Visit: Payer: No Typology Code available for payment source

## 2022-06-17 DIAGNOSIS — M5410 Radiculopathy, site unspecified: Secondary | ICD-10-CM | POA: Diagnosis not present

## 2022-06-17 DIAGNOSIS — Z6838 Body mass index (BMI) 38.0-38.9, adult: Secondary | ICD-10-CM | POA: Diagnosis not present

## 2023-09-26 DIAGNOSIS — R635 Abnormal weight gain: Secondary | ICD-10-CM | POA: Diagnosis not present

## 2023-09-26 DIAGNOSIS — E669 Obesity, unspecified: Secondary | ICD-10-CM | POA: Diagnosis not present

## 2023-10-02 ENCOUNTER — Telehealth: Payer: Self-pay | Admitting: *Deleted

## 2023-10-02 NOTE — Telephone Encounter (Signed)
 Advised pt that we can do tests for her and to make sure she brings her vaccine records.

## 2023-10-02 NOTE — Telephone Encounter (Signed)
 Copied from CRM #[id number]. Topic: Clinical - Request for Lab/Test Order >> Sep 29, 2023  3:29 PM Vena HERO wrote: Reason for CRM: pt is requesting TB Questionnaire and a Quantiferon Gold Blood Draw as well an updated TDAP vaccine for her 9/3 appt. Please reach out to pt and advise if needed at [phone]

## 2023-10-05 DIAGNOSIS — Z79899 Other long term (current) drug therapy: Secondary | ICD-10-CM | POA: Diagnosis not present

## 2023-10-05 DIAGNOSIS — F54 Psychological and behavioral factors associated with disorders or diseases classified elsewhere: Secondary | ICD-10-CM | POA: Diagnosis not present

## 2023-10-05 DIAGNOSIS — K219 Gastro-esophageal reflux disease without esophagitis: Secondary | ICD-10-CM | POA: Diagnosis not present

## 2023-10-05 DIAGNOSIS — E88819 Insulin resistance, unspecified: Secondary | ICD-10-CM | POA: Diagnosis not present

## 2023-10-05 DIAGNOSIS — E66813 Obesity, class 3: Secondary | ICD-10-CM | POA: Diagnosis not present

## 2023-10-06 NOTE — Progress Notes (Deleted)
 No chief complaint on file.      New Patient Visit SUBJECTIVE: HPI: Erika Howe is an 38 y.o.female who is being seen for establishing care.  The patient was previously seen at ***.  Employment: SH: Married***   Children****  Past Medical History:  Diagnosis Date   Localized swelling of both lower legs    Medical history non-contributory    PONV (postoperative nausea and vomiting)    PONV (postoperative nausea and vomiting)    Past Surgical History:  Procedure Laterality Date   INCISIONAL BREAST BIOPSY Right    LAPAROTOMY Right 07/09/2015   Procedure: LAPAROTOMY;  Surgeon: Duwaine Blumenthal, DO;  Location: [facility];  Service: Gynecology;  Laterality: Right;  right ovarian cyst removal.    OOPHORECTOMY Right 07/09/2015   Procedure: OOPHORECTOMY;  Surgeon: Duwaine Blumenthal, DO;  Location: [facility];  Service: Gynecology;  Laterality: Right;  possible   Family History  Problem Relation Age of Onset   Mental illness Mother    Allergies  Allergen Reactions   Doxycycline Nausea And Vomiting and Other (See Comments)    Current Outpatient Medications:    acetaminophen  (TYLENOL ) 500 MG tablet, Take 500-1,000 mg by mouth every 6 (six) hours as needed for mild pain., Disp: , Rfl:    Cholecalciferol (VITAMIN D) 50 MCG (2000 UT) tablet, Take 1 tablet by mouth daily., Disp: , Rfl:    ibuprofen  (ADVIL ) 200 MG tablet, Take 200 mg by mouth daily as needed for pain., Disp: , Rfl:    Multiple Vitamin (MULTI-VITAMIN) tablet, Take 1 tablet by mouth daily., Disp: , Rfl:  No current facility-administered medications for this visit.  Facility-Administered Medications Ordered in Other Visits:    methotrexate  (50 mg/ml) chemo injection 120 mg, 50 mg/m2, Intramuscular, Once, Gorge Ade, MD  PHQ9 Today:     No data to display         GAD7 Today:     No data to display          OBJECTIVE: There were no vitals taken for this visit. General:  well developed, well nourished, in no apparent  distress Skin:  no significant moles, warts, or growths Nose:  nares patent, septum midline, mucosa normal Throat/Pharynx:  lips and gingiva without lesion; tongue and uvula midline; non-inflamed pharynx; no exudates or postnasal drainage Lungs:  clear to auscultation, breath sounds equal bilaterally, no respiratory distress Cardio:  regular rate and rhythm, no LE edema or bruits Musculoskeletal:  symmetrical muscle groups noted without atrophy or deformity Neuro:  gait normal Psych: well oriented with normal range of affect and appropriate judgment/insight  ASSESSMENT/PLAN: No diagnosis found.  Patient instructed to sign release of records form from their previous PCP. The patient voiced understanding and agreement to the plan. Education provided today during visit and on AVS for patient to review at home.  Diet and Exercise recommendations provided.  Current diagnoses and recommendations discussed. HM recommendations reviewed with recommendations.   Patient should return ***. No follow-ups on file.   Harlene LITTIE Jolly, DNP, AGNP-C 10/06/23  2:51 PM

## 2023-10-06 NOTE — Patient Instructions (Signed)
 Thank you for choosing [facility] for your Primary Care needs. I am excited for the opportunity to partner with you to meet your health care goals. It was a pleasure meeting you today!  Information on diet, exercise, and health maintenance recommendations are listed below. This is information to help you be sure you are on track for optimal health and monitoring.   Please look over this and let us  know if you have any questions or if you have completed any of the health maintenance outside of [facility] so that we can be sure your records are up to date.  ___________________________________________________________  MyChart:  For all urgent or time sensitive needs we ask that you please call the office to avoid delays. Our number is [phone]. MyChart is not constantly monitored and due to the large volume of messages a day, replies may take up to 72 business hours.  MyChart Policy: MyChart allows for you to see your visit notes, after visit summary, provider recommendations, lab and tests results, make an appointment, request refills, and contact your provider or the office for non-urgent questions or concerns. Providers are seeing patients during normal business hours and do not have built in time to review MyChart messages.  We ask that you allow a minimum of 3 business days for responses to [employer]. For this reason, please do not send urgent requests through MyChart. Please call the office at [phone]. New and ongoing conditions may require a visit. We have virtual and in-person visits available for your convenience.  Complex MyChart concerns may require a visit. Your provider may request you schedule a virtual or in-person visit to ensure we are providing the best care possible. MyChart messages sent after 11:00 AM on Friday may not be received by the provider until Monday morning.    Lab and Test Results: You will receive your lab and test  results on MyChart as soon as they are completed and results have been sent by the lab or testing facility. Due to this service, you will receive your results BEFORE your provider.  I review lab and test results each morning prior to seeing patients. Some results require collaboration with other providers to ensure you are receiving the most appropriate care. For this reason, we ask that you please allow a minimum of 3-5 business days from the time that ALL results have been received for your provider to receive and review lab and test results and contact you about these.  Most lab and test result comments from the provider will be sent through MyChart. Your provider may recommend changes to the plan of care, follow-up visits, repeat testing, ask questions, or request an office visit to discuss these results. You may reply directly to this message or call the office to provide information for the provider or set up an appointment. In some instances, you will be called with test results and recommendations. Please let us  know if this is preferred and we will make note of this in your chart to provide this for you.    If you have not heard a response to your lab or test results in 5 business days from all results returning to MyChart, please call the office to let us  know. We ask that you please avoid calling prior to this time unless there is an emergent concern. Due to high call volumes, this can delay the resulting process.  After Hours: For all non-emergency after hours needs, please  call the office at [phone] and select the option to reach the on-call  service. On-call services are shared between multiple [facility] offices and therefore it will not be possible to speak directly with your provider. On-call providers may provide medical advice and recommendations, but are unable to provide refills for maintenance medications.  For all emergency or urgent medical needs after normal business hours, we  recommend that you seek care at the closest Urgent Care or Emergency Department to ensure appropriate treatment in a timely manner.  [facility] has a 24 hour emergency room located on the ground floor for your convenience.   Urgent Concerns During the Business Day Providers are seeing patients from 8AM to 5PM with a busy schedule and are most often not able to respond to non-urgent calls until the end of the day or the next business day. If you should have URGENT concerns during the day, please call and speak to the nurse or schedule a same day appointment so that we can address your concern without delay.   Thank you, again, for choosing me as your health care partner. I appreciate your trust and look forward to learning more about you!   Ica Daye L. Korene Pert, DNP, AGNP-C ___________________________________________________________  Health Maintenance Recommendations Screening Testing Mammogram Every 1-2 years based on history and risk factors Starting at age 7 Pap Smear Ages 21-39 every 3 years Ages 64-65 every 5 years with HPV testing More frequent testing may be required based on results and history Colon Cancer Screening Every 1-10 years based on test performed, risk factors, and history Starting at age 86 Bone Density Screening Every 2-10 years based on history Starting at age 50 for women Recommendations for men differ based on medication usage, history, and risk factors AAA Screening One time ultrasound Men 1-57 years old who have ever smoked Lung Cancer Screening Low Dose Lung CT every 12 months Age 27-80 years with a 20 pack-year smoking history who still smoke or who have quit within the last 15 years  Screening Labs Routine  Labs: Complete Blood Count (CBC), Complete Metabolic Panel (CMP), Cholesterol (Lipid Panel) Every 6-12 months based on history and medications May be recommended more frequently based on current conditions or previous  results Hemoglobin A1c Lab Every 3-12 months based on history and previous results Starting at age 29 or earlier with diagnosis of diabetes, high cholesterol, BMI >26, and/or risk factors Frequent monitoring for patients with diabetes to ensure blood sugar control Thyroid  Panel  Every 6 months based on history, symptoms, and risk factors May be repeated more often if on medication HIV One time testing for all patients 88 and older May be repeated more frequently for patients with increased risk factors or exposure Hepatitis C One time testing for all patients 78 and older May be repeated more frequently for patients with increased risk factors or exposure Gonorrhea, Chlamydia Every 12 months for all sexually active persons 13-24 years Additional monitoring may be recommended for those who are considered high risk or who have symptoms PSA Men 66-41 years old with risk factors Additional screening may be recommended from age 36-69 based on risk factors, symptoms, and history  Vaccine Recommendations Tetanus Booster All adults every 10 years Flu Vaccine All patients 6 months and older every year COVID Vaccine All patients 12 years and older Initial dosing with booster May recommend additional booster based on age and health history HPV Vaccine 2 doses all patients age 35-26 Dosing may be considered for  patients over 26 Shingles Vaccine (Shingrix) 2 doses all adults 50 years and older Pneumonia (Pneumovax 23) All adults 65 years and older May recommend earlier dosing based on health history Pneumonia (Prevnar 18) All adults 65 years and older Dosed 1 year after Pneumovax 23 Pneumonia (Prevnar 20) All adults 65 years and older (adults 19-64 with certain conditions or risk factors) 1 dose  For those who have not received Prevnar 13 vaccine previously   Additional Screening, Testing, and Vaccinations may be recommended on an individualized basis based on family history, health  history, risk factors, and/or exposure.  __________________________________________________________  Diet Recommendations for All Patients  I recommend that all patients maintain a diet low in saturated fats, carbohydrates, and cholesterol. While this can be challenging at first, it is not impossible and small changes can make big differences.  Things to try: Decreasing the amount of soda, sweet tea, and/or juice to one or less per day and replace with water While water is always the first choice, if you do not like water you may consider adding a water additive without sugar to improve the taste other sugar free drinks Replace potatoes with a brightly colored vegetable  Use healthy oils, such as canola oil or olive oil, instead of butter or hard margarine Limit your bread intake to two pieces or less a day Replace regular pasta with low carb pasta options Bake, broil, or grill foods instead of frying Monitor portion sizes  Eat smaller, more frequent meals throughout the day instead of large meals  An important thing to remember is, if you love foods that are not great for your health, you don't have to give them up completely. Instead, allow these foods to be a reward when you have done well. Allowing yourself to still have special treats every once in a while is a nice way to tell yourself thank you for working hard to keep yourself healthy.   Also remember that every day is a new day. If you have a bad day and fall off the wagon, you can still climb right back up and keep moving along on your journey!  We have resources available to help you!  Some websites that may be helpful include: www.http://www.wall-moore.info/  Www.VeryWellFit.com _____________________________________________________________  Activity Recommendations for All Patients  I recommend that all adults get at least 30 minutes of moderate physical activity that elevates your heart rate at least 5 days out of the week.  Some  examples include: Walking or jogging at a pace that allows you to carry on a conversation Cycling (stationary bike or outdoors) Water aerobics Yoga Weight lifting Dancing If physical limitations prevent you from putting stress on your joints, exercise in a pool or seated in a chair are excellent options.  Do determine your MAXIMUM heart rate for activity: 220 - YOUR AGE = MAX Heart Rate   Remember! Do not push yourself too hard.  Start slowly and build up your pace, speed, weight, time in exercise, etc.  Allow your body to rest between exercise and get good sleep. You will need more water than normal when you are exerting yourself. Do not wait until you are thirsty to drink. Drink with a purpose of getting in at least 8, 8 ounce glasses of water a day plus more depending on how much you exercise and sweat.    If you begin to develop dizziness, chest pain, abdominal pain, jaw pain, shortness of breath, headache, vision changes, lightheadedness, or other concerning symptoms, stop  the activity and allow your body to rest. If your symptoms are severe, seek emergency evaluation immediately. If your symptoms are concerning, but not severe, please let us  know so that we can recommend further evaluation.

## 2023-10-11 ENCOUNTER — Encounter: Payer: Self-pay | Admitting: *Deleted

## 2023-10-11 ENCOUNTER — Ambulatory Visit (INDEPENDENT_AMBULATORY_CARE_PROVIDER_SITE_OTHER): Admitting: Student

## 2023-10-11 ENCOUNTER — Encounter: Payer: Self-pay | Admitting: Student

## 2023-10-11 VITALS — BP 118/70 | HR 72 | Temp 98.4°F | Resp 12 | Ht 65.0 in | Wt 259.6 lb

## 2023-10-11 DIAGNOSIS — E66813 Obesity, class 3: Secondary | ICD-10-CM

## 2023-10-11 DIAGNOSIS — Z0289 Encounter for other administrative examinations: Secondary | ICD-10-CM | POA: Insufficient documentation

## 2023-10-11 DIAGNOSIS — Z7689 Persons encountering health services in other specified circumstances: Secondary | ICD-10-CM

## 2023-10-11 DIAGNOSIS — Z6841 Body Mass Index (BMI) 40.0 and over, adult: Secondary | ICD-10-CM

## 2023-10-11 DIAGNOSIS — Z0000 Encounter for general adult medical examination without abnormal findings: Secondary | ICD-10-CM

## 2023-10-11 DIAGNOSIS — L918 Other hypertrophic disorders of the skin: Secondary | ICD-10-CM | POA: Diagnosis not present

## 2023-10-11 DIAGNOSIS — Z13220 Encounter for screening for lipoid disorders: Secondary | ICD-10-CM

## 2023-10-11 DIAGNOSIS — Z111 Encounter for screening for respiratory tuberculosis: Secondary | ICD-10-CM | POA: Diagnosis not present

## 2023-10-11 LAB — COMPREHENSIVE METABOLIC PANEL WITH GFR
ALT: 29 U/L (ref 0–35)
AST: 22 U/L (ref 0–37)
Albumin: 4.1 g/dL (ref 3.5–5.2)
Alkaline Phosphatase: 65 U/L (ref 39–117)
BUN: 15 mg/dL (ref 6–23)
CO2: 27 meq/L (ref 19–32)
Calcium: 9 mg/dL (ref 8.4–10.5)
Chloride: 104 meq/L (ref 96–112)
Creatinine, Ser: 0.65 mg/dL (ref 0.40–1.20)
GFR: 110.64 mL/min (ref >60.00)
Glucose, Bld: 88 mg/dL (ref 70–99)
Potassium: 4.4 meq/L (ref 3.5–5.1)
Sodium: 138 meq/L (ref 135–145)
Total Bilirubin: 0.5 mg/dL (ref 0.2–1.2)
Total Protein: 7 g/dL (ref 6.0–8.3)

## 2023-10-11 LAB — LIPID PANEL
Cholesterol: 158 mg/dL (ref 0–200)
HDL: 41.7 mg/dL (ref >39.00)
LDL Cholesterol: 99 mg/dL (ref 0–99)
NonHDL: 115.81
Total CHOL/HDL Ratio: 4
Triglycerides: 82 mg/dL (ref 0.0–149.0)
VLDL: 16.4 mg/dL (ref 0.0–40.0)

## 2023-10-11 LAB — CBC WITH DIFFERENTIAL/PLATELET
Basophils Absolute: 0 K/uL (ref 0.0–0.1)
Basophils Relative: 0.4 % (ref 0.0–3.0)
Eosinophils Absolute: 0.1 K/uL (ref 0.0–0.7)
Eosinophils Relative: 1.3 % (ref 0.0–5.0)
HCT: 42.6 % (ref 36.0–46.0)
Hemoglobin: 13.7 g/dL (ref 12.0–15.0)
Lymphocytes Relative: 31.7 % (ref 12.0–46.0)
Lymphs Abs: 2.1 K/uL (ref 0.7–4.0)
MCHC: 32.2 g/dL (ref 30.0–36.0)
MCV: 87.2 fl (ref 78.0–100.0)
Monocytes Absolute: 0.4 K/uL (ref 0.1–1.0)
Monocytes Relative: 5.7 % (ref 3.0–12.0)
Neutro Abs: 4 K/uL (ref 1.4–7.7)
Neutrophils Relative %: 60.9 % (ref 43.0–77.0)
Platelets: 255 K/uL (ref 150.0–400.0)
RBC: 4.89 Mil/uL (ref 3.87–5.11)
RDW: 14 % (ref 11.5–15.5)
WBC: 6.6 K/uL (ref 4.0–10.5)

## 2023-10-11 LAB — TSH: TSH: 1.13 u[IU]/mL (ref 0.35–5.50)

## 2023-10-11 NOTE — Progress Notes (Signed)
 Subjective:     Patient ID: Erika Howe, female    DOB: March 25, 1985, 38 y.o.   MRN: [id number]  Chief Complaint  Patient presents with   Establish Care   Annual Exam    HPI   HPI: Erika Howe is an 38 y.o.female Patient presents to establish care, for an annual physical, immunizations, and completion of a school-required form. 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 She notes being adopted, with limited knowledge of family history. Patient is divorced and has a 79-year-old daughter, Erika Howe. She is currently living with her sister  Patient recently started the [employer] meal replacement program through [facility] (began yesterday). Trying to incorporate more exercise- working out three times per week with a personal trainer currently, incorporating both strength training and cardio. Reports sleeping fairly well overall, though she experiences some difficulty due to working night shifts.  Non-smoker and reports occasional alcohol use on special occasions, but very rare.   Follows with GYN- Erika Howe  The patient was previously seen at [facility], records pending- reports she has not seen PCP for many years.  Patient denies fever, chills, SOB, CP, palpitations, dyspnea, edema, HA, vision changes, N/V/D, abdominal pain, urinary symptoms, rash, weight changes, and recent illness or hospitalizations.   History of Present Illness              Health Maintenance Due  Topic Date Due   Cervical Cancer Screening (HPV/Pap Cotest)  Never done   COVID-19 Vaccine (3 - Pfizer risk series) 07/04/2019   INFLUENZA VACCINE  09/08/2023    Past Medical History:  Diagnosis Date   Heart murmur    As a infant   Localized swelling of both lower legs    Medical history non-contributory    PONV (postoperative nausea and vomiting)    PONV  (postoperative nausea and vomiting)     Past Surgical History:  Procedure Laterality Date   BREAST SURGERY     At 14 benign breast lump removed.   INCISIONAL BREAST BIOPSY Right    LAPAROTOMY Right 07/09/2015   Procedure: LAPAROTOMY;  Surgeon: Duwaine Blumenthal, DO;  Location: [facility];  Service: Gynecology;  Laterality: Right;  right ovarian cyst removal.    OOPHORECTOMY Right 07/09/2015   Procedure: OOPHORECTOMY;  Surgeon: Duwaine Blumenthal, DO;  Location: [facility];  Service: Gynecology;  Laterality: Right;  possible    Family History  Problem Relation Age of Onset   Mental illness Mother     Social History   Socioeconomic History   Marital status: Divorced    Spouse name: Not on file   Number of children: 1   Years of education: Not on file   Highest education level: Bachelor's degree (e.g., BA, AB, BS)  Occupational History   Not on file  Tobacco Use   Smoking status: Never   Smokeless tobacco: Never  Substance and Sexual Activity   Alcohol use: Not Currently    Comment: occasional   Drug use: No   Sexual activity: Not Currently    Partners: Male    Birth control/protection: Abstinence, None  Other Topics Concern   Not on file  Social History Narrative   Not on file   Social Drivers of Health   Financial Resource Strain: Low Risk  (10/11/2023)   Overall Financial Resource Strain (CARDIA)    Difficulty of Paying  Living Expenses: Not hard at all  Food Insecurity: No Food Insecurity (10/11/2023)   Hunger Vital Sign    Worried About Running Out of Food in the Last Year: Never true    Ran Out of Food in the Last Year: Never true  Transportation Needs: No Transportation Needs (10/11/2023)   PRAPARE - Administrator, Civil Service (Medical): No    Lack of Transportation (Non-Medical): No  Physical Activity: Inactive (10/11/2023)   Exercise Vital Sign    Days of Exercise per Week: 0 days    Minutes of Exercise per Session: Not on file  Stress: No Stress Concern Present  (10/11/2023)   [employer] of Occupational Health - Occupational Stress Questionnaire    Feeling of Stress: Only a little  Social Connections: Moderately Isolated (10/11/2023)   Social Connection and Isolation Panel    Frequency of Communication with Friends and Family: More than three times a week    Frequency of Social Gatherings with Friends and Family: Three times a week    Attends Religious Services: More than 4 times per year    Active Member of Clubs or Organizations: No    Attends Banker Meetings: Not on file    Marital Status: Divorced  Intimate Partner Violence: Unknown (05/14/2021)   Received from [facility]   HITS    Physically Hurt: Not on file    Insult or Talk Down To: Not on file    Threaten Physical Harm: Not on file    Scream or Curse: Not on file    Outpatient Medications Prior to Visit  Medication Sig Dispense Refill   Prenatal Vit-Fe Fumarate-FA (PRENATAL VITAMINS PO) Take 2 tablets by mouth daily.     acetaminophen  (TYLENOL ) 500 MG tablet Take 500-1,000 mg by mouth every 6 (six) hours as needed for mild pain.     Cholecalciferol (VITAMIN D) 50 MCG (2000 UT) tablet Take 1 tablet by mouth daily.     ibuprofen  (ADVIL ) 200 MG tablet Take 200 mg by mouth daily as needed for pain.     Multiple Vitamin (MULTI-VITAMIN) tablet Take 1 tablet by mouth daily.     Facility-Administered Medications Prior to Visit  Medication Dose Route Frequency Provider Last Rate Last Admin   methotrexate  (50 mg/ml) chemo injection 120 mg  50 mg/m2 Intramuscular Once Taavon, Richard, MD        Allergies  Allergen Reactions   Doxycycline Nausea And Vomiting and Other (See Comments)    ROS See HPI    Objective:    Physical Exam Constitutional:      General: She is not in acute distress.    Appearance: She is obese. She is not ill-appearing, toxic-appearing or diaphoretic.  HENT:     Head: Normocephalic and atraumatic.     Right Ear: Tympanic membrane, ear canal  and external ear normal.     Left Ear: Tympanic membrane, ear canal and external ear normal.     Nose: Nose normal. No congestion.     Mouth/Throat:     Mouth: Mucous membranes are moist.     Pharynx: Oropharynx is clear.  Eyes:     Extraocular Movements: Extraocular movements intact.     Right eye: Normal extraocular motion.     Left eye: Normal extraocular motion.     Conjunctiva/sclera: Conjunctivae normal.     Pupils: Pupils are equal, round, and reactive to light.  Neck:     Thyroid: No thyroid mass or thyromegaly.  Vascular: No carotid bruit or JVD.  Cardiovascular:     Rate and Rhythm: Normal rate and regular rhythm.     Pulses: Normal pulses.          Radial pulses are 2+ on the right side and 2+ on the left side.       Dorsalis pedis pulses are 2+ on the right side and 2+ on the left side.     Heart sounds: Normal heart sounds, S1 normal and S2 normal. No murmur heard.    No friction rub. No gallop.  Pulmonary:     Effort: Pulmonary effort is normal. No respiratory distress.     Breath sounds: Normal breath sounds.  Abdominal:     General: Bowel sounds are normal. There is no distension.     Palpations: Abdomen is soft.     Tenderness: There is no abdominal tenderness. There is no guarding.  Musculoskeletal:        General: Normal range of motion.     Cervical back: Full passive range of motion without pain and normal range of motion. No edema or erythema.     Right lower leg: No edema.     Left lower leg: No edema.  Lymphadenopathy:     Cervical: No cervical adenopathy.  Skin:    General: Skin is warm and dry.     Capillary Refill: Capillary refill takes less than 2 seconds.     Comments: Skin tag right armpit  Neurological:     General: No focal deficit present.     Mental Status: She is alert and oriented to person, place, and time.     Cranial Nerves: No cranial nerve deficit.     Motor: No weakness.     Coordination: Coordination normal.     Gait: Gait  normal.     Deep Tendon Reflexes: Reflexes normal.  Psychiatric:        Mood and Affect: Mood normal.        Behavior: Behavior normal.        Thought Content: Thought content normal.     BMI Readings from Last 1 Encounters:  10/11/23 43.20 kg/m     BP 118/70 (BP Location: Left Arm, Patient Position: Sitting, Cuff Size: Normal)   Pulse 72   Temp 98.4 F (36.9 C) (Oral)   Resp 12   Ht 5' 5 (1.651 m)   Wt 259 lb 9.6 oz (117.8 kg)   LMP 10/03/2023   SpO2 98%   BMI 43.20 kg/m  Wt Readings from Last 3 Encounters:  10/11/23 259 lb 9.6 oz (117.8 kg)  04/30/20 217 lb 1.3 oz (98.5 kg)  06/16/16 260 lb 1.9 oz (118 kg)       Assessment & Plan:   Problem List Items Addressed This Visit     Annual visit for general adult medical examination without abnormal findings - Primary   Patient encouraged to maintain heart healthy diet, regular exercise, adequate sleep. Consider daily probiotics.       Relevant Orders   CBC with Differential/Platelet   Comprehensive metabolic panel with GFR   Lipid panel   TSH   Class 3 severe obesity due to excess calories without serious comorbidity with body mass index (BMI) of 40.0 to 44.9 in adult   Working with [facility] [employer] for meal planning. Started this week. Encouraged DASH or MIND diet, decrease po intake and increase exercise as tolerated. Needs 7-8 hours of sleep nightly. Avoid trans fats, eat small,  frequent meals every 4-5 hours with lean proteins, complex carbs and healthy fats. Minimize simple carbs, high fat foods and processed foods        Relevant Orders   CBC with Differential/Platelet   Comprehensive metabolic panel with GFR   Lipid panel   TSH   Encounter for completion of form with patient   Completing school forms for the patient, including immunizations, physical assessment, and readiness documentation. Patient will pick up the completed form once lab results are reviewed and all requirements are finalized.       Other Visit Diagnoses       Encounter to establish care         Screening examination for pulmonary tuberculosis       Relevant Orders   QuantiFERON-TB Gold Plus     Skin tag       Relevant Orders   Ambulatory referral to Dermatology      HCM Pap: Due.  Pt follows with GYN ([facility]) Pt to request record sent  Immunizations: Pt to get Covid-19 and Flu vaccine at pharmacyplease update via Mychart  FU 1 year CPE  Portions of this note were dictated using DRAGON voice recognition software. Please disregard any errors in transcription.     I have discontinued Lyanna Brummitt's acetaminophen , ibuprofen , Multi-Vitamin, and Vitamin D. I am also having her maintain her Prenatal Vit-Fe Fumarate-FA (PRENATAL VITAMINS PO).  No orders of the defined types were placed in this encounter.

## 2023-10-11 NOTE — Assessment & Plan Note (Addendum)
 Working with [facility] [employer] for meal planning. Started this week. Encouraged DASH or MIND diet, decrease po intake and increase exercise as tolerated. Needs 7-8 hours of sleep nightly. Avoid trans fats, eat small, frequent meals every 4-5 hours with lean proteins, complex carbs and healthy fats. Minimize simple carbs, high fat foods and processed foods

## 2023-10-11 NOTE — Assessment & Plan Note (Signed)
Patient encouraged to maintain heart healthy diet, regular exercise, adequate sleep. Consider daily probiotics 

## 2023-10-11 NOTE — Assessment & Plan Note (Signed)
 Completing school forms for the patient, including immunizations, physical assessment, and readiness documentation. Patient will pick up the completed form once lab results are reviewed and all requirements are finalized.

## 2023-10-13 LAB — QUANTIFERON-TB GOLD PLUS
Mitogen-NIL: 7.31 [IU]/mL
NIL: 0.02 [IU]/mL
QuantiFERON-TB Gold Plus: NEGATIVE
TB1-NIL: 0.01 [IU]/mL
TB2-NIL: 0.03 [IU]/mL

## 2023-10-17 ENCOUNTER — Ambulatory Visit: Payer: Self-pay | Admitting: *Deleted

## 2023-10-19 DIAGNOSIS — Z6841 Body Mass Index (BMI) 40.0 and over, adult: Secondary | ICD-10-CM | POA: Diagnosis not present

## 2023-10-19 DIAGNOSIS — Z713 Dietary counseling and surveillance: Secondary | ICD-10-CM | POA: Diagnosis not present

## 2023-10-19 DIAGNOSIS — E669 Obesity, unspecified: Secondary | ICD-10-CM | POA: Diagnosis not present

## 2023-10-20 ENCOUNTER — Encounter: Payer: Self-pay | Admitting: *Deleted

## 2023-10-23 ENCOUNTER — Telehealth: Payer: Self-pay

## 2023-10-23 NOTE — Telephone Encounter (Signed)
 Appt scheduled

## 2023-10-23 NOTE — Telephone Encounter (Signed)
 Initial Comment Caller having fullness and pain the left ear, feels like fluid in the ear. Patient see's NP Harlene Leatherwood. Translation No Nurse Assessment Nurse: Artis, RN, Angelica Date/Time (Eastern Time): 10/21/2023 7:59:24 AM Confirm and document reason for call. If symptomatic, describe symptoms. ---Caller states left ear pain and fullness and chills on and off x 3 days. denies any fever. states she tried to clean her ear out about a week ago. Does the patient have any new or worsening symptoms? ---Yes Will a triage be completed? ---Yes Related visit to physician within the last 2 weeks? ---No Does the PT have any chronic conditions? (i.e. diabetes, asthma, this includes High risk factors for pregnancy, etc.) ---No Is the patient pregnant or possibly pregnant? (Ask all females between the ages of 70-55) ---No Is this a behavioral health or substance abuse call? ---No Guidelines Guideline Title Affirmed Question Affirmed Notes Nurse Date/Time Titus Time) Ear - Congestion Ear congestion Artis, RN, Angelica 10/21/2023 8:00:57 AM Disp. Time Titus Time) Disposition Final User 10/21/2023 8:05:21 AM Home Care Yes Artis, RN, Angelica Final Disposition 10/21/2023 8:05:21 AM Home Care Yes Monahan, RN, Angelica PLEASE NOTE: All timestamps contained within this report are represented as [place] Standard Time. CONFIDENTIALTY NOTICE: This fax transmission is intended only for the addressee. It contains information that is legally privileged, confidential or otherwise protected from use or disclosure. If you are not the intended recipient, you are strictly prohibited from reviewing, disclosing, copying using or disseminating any of this information or taking any action in reliance on or regarding this information. If you have received this fax in error, please notify us  immediately by telephone so that we can arrange for its return to us . Phone: [phone], Toll-Free:  [phone], Fax: [phone] Erika Howe May 20, 1985 Page: 1 of2 CallId: [id number] Caller Disagree/Comply Disagree Caller Understands Yes PreDisposition Go to Urgent Care/Walk-In Clinic Care Advice Given Per Guideline HOME CARE: * You should be able to treat this at home. REASSURANCE AND EDUCATION - EAR CONGESTION: * Ear congestion are the symptoms of a stuffy, full, or plugged sensation in the ear. People also sometimes describe crackling or popping noises in the ear. Sometimes hearing seems slightly muffled. * There is a small tube that runs between the middle ear and the nose called the EUSTACHIAN TUBE. Normally, it allows tiny amounts of air to move in and out of the middle ear. When the tube gets blocked, air or fluid can build up behind the eardrum (tympanic membrane). This causes the symptoms of ear congestion. * The two most common causes of blockage are NASAL ALLERGIES (hay fever) and UPPER RESPIRATORY INFECTIONS (such as the common cold or flu). EAR CONGESTION - CAUSES: EAR CONGESTION - TREATMENT: * Simple HOME REMEDIES are all that is needed for mild symptoms. A person can try chewing gum, swallowing or swallowing while pinching the nose, and yawning. Two other home remedies that can help are nasal washes or the valsalva maneuver. Here is how to do the valsalva maneuver: take a small breath, pinch off your nose, and then try to gently force air through the pinched-off nostrils. * Treatment depends on the cause and duration of symptoms. Often the symptoms will go away on their own with no treatment. NASAL DECONGESTANTS FOR EAR CONGESTION: * If chewing or swallowing doesn't help after 1 or 2 hours, you can try using an over-the-counter (OTC) NASAL DECONGESTANT DROPS OR SPRAY. You can use the nose drops twice a day. * Oxymetazoline Nasal Drops (such as Afrin): Available  OTC. Clean out the nose before using. Spray each nostril once, wait one minute for absorption, and then spray  a second time. * Do not use these medicines if you have high blood pressure, heart disease, prostate problems, or an overactive thyroid. * Before using any medicine, read all the instructions on the package. EXPECTED COURSE: * Ear congestion symptoms usually get better within 3 days (72 hours) with treatment. * It is safe to swim. CALL BACK IF: * Ear congestion lasts over 3 days * Ear pain or fever occurs * You become worse CARE ADVICE given per Ear - Congestion (Adult) guideline. NASAL DECONGESTANTS - EXTRA NOTES AND WARNINGS: Comments User: Chuck Ling, RN Date/Time (Eastern Time): 10/21/2023 8:01:20 AM pain is a 6 on 1/10 scale User: Chuck Ling, RN Date/Time Titus Time): 10/21/2023 8:08:52 AM Caller states she wants antibiotics for her ear fullness. explained that [facility] provider does not call in medications after hours. Caller states she is going to go to urgent care as she thinks that antibiotics is what is going to help. provide patient closest UC at her request. Pt advised to call back if any new or worsening symptoms. Referrals [facility] - UC

## 2023-10-24 NOTE — Progress Notes (Unsigned)
   Acute Office Visit  Subjective:     Patient ID: Rosella Crandell, female    DOB: 01-25-1986, 38 y.o.   MRN: [id number]  No chief complaint on file.   HPI Patient is in today for left ear pain and fullness x 3 days     ROS  See HPI    Objective:    LMP 10/03/2023  {Vitals History (Optional):23777}  Physical Exam  No results found for any visits on 10/25/23.      Assessment & Plan:   Problem List Items Addressed This Visit   None   No orders of the defined types were placed in this encounter.   No follow-ups on file.  Leonardo Makris L Harim Bi, NP

## 2023-10-25 ENCOUNTER — Encounter: Payer: Self-pay | Admitting: Student

## 2023-10-25 ENCOUNTER — Ambulatory Visit: Admitting: Student

## 2023-10-25 VITALS — BP 130/90 | HR 78 | Temp 98.4°F | Resp 18 | Ht 65.0 in | Wt 260.6 lb

## 2023-10-25 DIAGNOSIS — H6123 Impacted cerumen, bilateral: Secondary | ICD-10-CM | POA: Diagnosis not present

## 2023-10-25 DIAGNOSIS — R0981 Nasal congestion: Secondary | ICD-10-CM

## 2023-10-25 DIAGNOSIS — H938X2 Other specified disorders of left ear: Secondary | ICD-10-CM

## 2023-10-25 MED ORDER — CETIRIZINE HCL 10 MG PO TABS: 10.0000 mg | ORAL_TABLET | Freq: Every day | ORAL | 6 refills | Status: AC

## 2023-10-25 MED ORDER — FLUTICASONE PROPIONATE 50 MCG/ACT NA SUSP: 2.0000 | Freq: Every day | NASAL | 6 refills | Status: AC

## 2023-10-25 MED ORDER — GUAIFENESIN ER 600 MG PO TB12: 600.0000 mg | ORAL_TABLET | Freq: Two times a day (BID) | ORAL | 0 refills | Status: AC | PRN

## 2023-10-25 NOTE — Progress Notes (Addendum)
 Chief Complaint  Patient presents with   Ear Pain    Both - onset: both .  Popping      Erika Howe here for URI complaints. History of Present Illness Erika Howe is a 38 year old female who presents with bilateral ear fullness and discomfort.  She experiences a sensation of fullness and discomfort in her left ear for one week, with symptoms now present in both ears. She used Debrox solution, which resulted in a persistent 'crinkle, pop' sound. The solution was left in for five minutes before rinsing. Despite this, fullness and occasional sharp pain persist, with pain rated at 3/10 over the past 2 days bilaterally. She denies fever, nasal drainage, or tooth pain but feels slightly congested with mild sinus pressure. She underwent ear irrigation five years ago. Denies sick contanct  Patient denies fever, chills, SOB, CP, palpitations, dyspnea, edema, HA, vision changes, N/V/D, abdominal pain, urinary symptoms, rash, weight changes, and recent illness or hospitalizations.     Past Medical History:  Diagnosis Date   Heart murmur    As a infant   Localized swelling of both lower legs    Medical history non-contributory    PONV (postoperative nausea and vomiting)    PONV (postoperative nausea and vomiting)     Objective BP (!) 130/90   Pulse 78   Temp 98.4 F (36.9 C)   Resp 18   Ht 5' 5 (1.651 m)   Wt 260 lb 9.6 oz (118.2 kg)   LMP 10/03/2023   SpO2 95%   Breastfeeding No   BMI 43.37 kg/m  General: Awake, alert, appears stated age HEENT: AT, [place], ears patent b/l and TM's neg, nares patent w/o discharge, pharynx pink and without exudates, MMM. B/l cerumen impaction; L>R Neck: No masses or asymmetry Heart: RRR Lungs: CTAB, no accessory muscle use Psych: Age appropriate judgment and insight, normal mood and affect  Ear fullness, left - Plan: fluticasone  (FLONASE ) 50 MCG/ACT nasal spray, cetirizine  (ZYRTEC ) 10 MG tablet, guaiFENesin  (MUCINEX ) 600 MG 12 hr  tablet  Impacted cerumen of both ears  Nasal congestion  Ear fullness/ URI symptoms Rx- Mucinex , Zyrtec , & flonase  Education provided on watchful waiting and likely symptoms are from viral or allergic process. If symptoms persist over 10 days or worsen RTC. Continue to push fluids, practice good hand hygiene. F/u prn. If starting to experience fevers, shaking, or shortness of breath, seek immediate care. Pt voiced understanding and agreement to the plan.  Bilateral ear discomfort and fullness Symptoms suggest allergies or viral etiology. No infection observed. - Perform ear irrigation to remove cerumen impaction b/l ears; TMS WNL post irrigation - Advise use of Mucinex  with adequate hydration. - Recommend ibuprofen  or Tylenol  for pain and inflammation. - Instruct to return if symptoms worsen or persist beyond ten days, or if fever, facial pain, or other symptoms develop. - Recommend ibuprofen  or Tylenol  for pain and inflammation.  Nasal congestion Likely related to allergies or viral infection.  - Rx as stated above    Harlene LITTIE Jolly, DNP, AGNP-C 10/25/23 11:13 AM

## 2023-10-26 DIAGNOSIS — E66813 Obesity, class 3: Secondary | ICD-10-CM | POA: Diagnosis not present

## 2023-10-26 DIAGNOSIS — F54 Psychological and behavioral factors associated with disorders or diseases classified elsewhere: Secondary | ICD-10-CM | POA: Diagnosis not present

## 2023-10-26 DIAGNOSIS — K219 Gastro-esophageal reflux disease without esophagitis: Secondary | ICD-10-CM | POA: Diagnosis not present

## 2023-10-26 DIAGNOSIS — Z713 Dietary counseling and surveillance: Secondary | ICD-10-CM | POA: Diagnosis not present

## 2023-10-26 DIAGNOSIS — Z79899 Other long term (current) drug therapy: Secondary | ICD-10-CM | POA: Diagnosis not present

## 2023-12-26 DIAGNOSIS — Z713 Dietary counseling and surveillance: Secondary | ICD-10-CM | POA: Diagnosis not present

## 2023-12-26 DIAGNOSIS — E669 Obesity, unspecified: Secondary | ICD-10-CM | POA: Diagnosis not present

## 2023-12-26 DIAGNOSIS — Z6841 Body Mass Index (BMI) 40.0 and over, adult: Secondary | ICD-10-CM | POA: Diagnosis not present

## 2024-01-29 ENCOUNTER — Telehealth: Payer: Self-pay

## 2024-01-29 ENCOUNTER — Encounter: Payer: Self-pay | Admitting: Student

## 2024-01-29 ENCOUNTER — Ambulatory Visit: Payer: Self-pay

## 2024-01-29 ENCOUNTER — Ambulatory Visit: Admitting: Student

## 2024-01-29 VITALS — BP 133/87 | HR 93 | Ht 65.0 in | Wt 272.2 lb

## 2024-01-29 DIAGNOSIS — L2489 Irritant contact dermatitis due to other agents: Secondary | ICD-10-CM | POA: Insufficient documentation

## 2024-01-29 DIAGNOSIS — R238 Other skin changes: Secondary | ICD-10-CM | POA: Diagnosis not present

## 2024-01-29 NOTE — Telephone Encounter (Signed)
 Initial Comment Caller states she went on a cruise recently and now has swelling in both legs, itching on her abdomen and arm and a rash on the inside of both of her thigs. Translation No Nurse Assessment Nurse: Darby, RN, Erika Howe Date/Time (Eastern Time): 01/29/2024 5:47:17 AM Confirm and document reason for call. If symptomatic, describe symptoms. ---Caller states she went on a cruise returned last night and now has swelling in both legs, below the knee. Pt has swelling and red rash to inner thighs. Pt has itching to arms and abd as well but no rash. Symptoms started Saturday. Pt also has nasal congestion. Does the patient have any new or worsening symptoms? ---Yes Will a triage be completed? ---Yes Related visit to physician within the last 2 weeks? ---N/A Does the PT have any chronic conditions? (i.e. diabetes, asthma, this includes High risk factors for pregnancy, etc.) ---No Is the patient pregnant or possibly pregnant? (Ask all females between the ages of 51-55) ---No Is this a behavioral health or substance abuse call? ---No Guidelines Guideline Title Affirmed Question Affirmed Notes Nurse Date/Time (Eastern Time) Leg Swelling and Edema [1] Can't walk or can barely walk AND [2] new-onset Darby, RN, Erika Howe 01/29/2024 5:49:56 AM PLEASE NOTE: All timestamps contained within this report are represented as Eastern Standard Time. CONFIDENTIALTY NOTICE: This fax transmission is intended only for the addressee. It contains information that is legally privileged, confidential or otherwise protected from use or disclosure. If you are not the intended recipient, you are strictly prohibited from reviewing, disclosing, copying using or disseminating any of this information or taking any action in reliance on or regarding this information. If you have received this fax in error, please notify us  immediately by telephone so that we can arrange for its return to us .  Phone: [phone], Toll-Free: [phone], Fax: [phone] Erika Howe Feb 14, 1985 Page: 1 of2 CallId: [id number] Disp. Time Titus Time) Disposition Final User 01/29/2024 5:51:44 AM Go to ED Now Yes Darby, RN, Erika Howe Final Disposition 01/29/2024 5:51:44 AM Go to ED Now Yes Darby, RN, Richerd Flint Disagree/Comply Disagree Caller Understands Yes PreDisposition InappropriateToAsk Care Advice Given Per Guideline GO TO ED NOW: * You need to be seen in the Emergency Department. CARE ADVICE given per Leg Swelling and Edema (Adult) guideline. Comments User: Richerd Darby, RN Date/Time Titus Time): 01/29/2024 5:52:46 AM Caller states she would like an appointment. Referrals GO TO FACILITY REFUSED

## 2024-01-29 NOTE — Telephone Encounter (Signed)
 Pt seen today

## 2024-01-29 NOTE — Progress Notes (Signed)
" ° °  Acute Office Visit  Subjective:     Patient ID: Erika Howe, female    DOB: 1985-12-31, 38 y.o.   MRN: [id number]  No chief complaint on file.   Discussed the use of AI scribe software for clinical note transcription with the patient, who gave verbal consent to proceed.   HPI   History of Present Illness Erika Howe is a 38 year old female who presents for acute visit  after returning from a cruise. During a recent cruise to the [place] she had diarrhea, which resolved after returning home. On Saturday morning she developed a red, hot, slightly itchy, swollen rash with dimpling between her thighs, localized to the thighs, with associated swelling of the feet and ankles that was worse during the trip and remains mild. She has never had a similar rash. The thigh rash and generalized itching are improving. No other family members are affected. She has not used anything OTC.  Patient denies fever, chills, SOB, CP, palpitations, dyspnea, edema, HA, vision changes, N/V/D, abdominal pain, urinary symptoms, weight changes, and recent illness or hospitalizations.     ROS See HPI     Objective:    BP 133/87   Pulse 93   Ht 5' 5 (1.651 m)   Wt 272 lb 3.2 oz (123.5 kg)   SpO2 98%   BMI 45.30 kg/m    Physical Exam  General: No acute distress. Awake and conversant. +obese Eyes: Normal conjunctiva, anicteric. Round symmetric pupils.  Respiratory: CTAB. Respirations are non-labored. No wheezing.  Skin: Warm. Dry. Intact +mild topical erythema inner thighs, excoriated skin, no signs if infection Psych: Alert and oriented. Cooperative, Appropriate mood and affect, Normal judgment.  CV: RRR. No murmur. No lower extremity edema.  MSK: Normal ambulation. No clubbing or cyanosis.  Neuro:  CN II-XII grossly normal.    No results found for any visits on 01/29/24.      Assessment & Plan:   Problem List Items Addressed This Visit       Musculoskeletal and Integument    Irritant contact dermatitis due to other agents     Other   Skin irritation - Primary    Acute contact dermatitis likely from sand exposure, sweat, heat, and shaving irritation. Symptoms improving. Differential includes allergic reaction, no cellulitis. - Advised moisturizing skin. - If symptoms recur, use Claritin 10 mg and famotidine 20 mg daily  for 5-7 days. - Apply topical hydrocortisone to itchy areas if needed. - Advised avoiding scratching -Counseled on prevention of skin irritation from friction and moisture in the inner thigh area, particularly with heat, sweating, or prolonged activity. Reviewed strategies including barrier creams, breathable clothing, and minimizing moisture to support skin integrity and comfort.  Seek urgent care if you develop swelling of the lips or tongue, trouble breathing, or worsening symptoms.  No orders of the defined types were placed in this encounter.   No follow-ups on file.  Erika LITTIE Jolly, NP   "
